# Patient Record
Sex: Female | Born: 2000 | Hispanic: No | Marital: Single | Smoking: Never smoker
Health system: Southern US, Community
[De-identification: ages and names within clinical notes are randomized; demographics above are authoritative.]

## PROBLEM LIST (undated history)

## (undated) DIAGNOSIS — F509 Eating disorder, unspecified: Secondary | ICD-10-CM

---
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
## 2021-03-09 ENCOUNTER — Emergency Department (HOSPITAL_BASED_OUTPATIENT_CLINIC_OR_DEPARTMENT_OTHER)
Admission: EM | Admit: 2021-03-09 | Discharge: 2021-03-10 | Disposition: A | Discharge status: Other Acute Inpatient Hospital | Payer: 59 | Source: Home / Self Care | Attending: Emergency Medicine | Admitting: Emergency Medicine

## 2021-03-09 ENCOUNTER — Other Ambulatory Visit: Payer: Self-pay

## 2021-03-09 DIAGNOSIS — T1491XA Suicide attempt, initial encounter: Secondary | ICD-10-CM

## 2021-03-09 DIAGNOSIS — T43222A Poisoning by selective serotonin reuptake inhibitors, intentional self-harm, initial encounter: Secondary | ICD-10-CM | POA: Insufficient documentation

## 2021-03-09 DIAGNOSIS — F332 Major depressive disorder, recurrent severe without psychotic features: Secondary | ICD-10-CM | POA: Diagnosis not present

## 2021-03-09 DIAGNOSIS — T50902A Poisoning by unspecified drugs, medicaments and biological substances, intentional self-harm, initial encounter: Secondary | ICD-10-CM

## 2021-03-09 DIAGNOSIS — Z20822 Contact with and (suspected) exposure to covid-19: Secondary | ICD-10-CM | POA: Insufficient documentation

## 2021-03-09 DIAGNOSIS — Y900 Blood alcohol level of less than 20 mg/100 ml: Secondary | ICD-10-CM | POA: Insufficient documentation

## 2021-03-09 LAB — RESP PANEL BY RT-PCR (RSV, FLU A&B, COVID)  RVPGX2
Influenza A by PCR: NEGATIVE
Influenza B by PCR: NEGATIVE
Resp Syncytial Virus by PCR: NEGATIVE
SARS Coronavirus 2 by RT PCR: NEGATIVE

## 2021-03-09 LAB — CBC WITH DIFFERENTIAL/PLATELET
Abs Immature Granulocytes: 0.03 10*3/uL (ref 0.00–0.07)
Basophils Absolute: 0.1 10*3/uL (ref 0.0–0.1)
Basophils Relative: 1 %
Eosinophils Absolute: 0.2 10*3/uL (ref 0.0–0.5)
Eosinophils Relative: 1 %
HCT: 40.6 % (ref 36.0–46.0)
Hemoglobin: 13.3 g/dL (ref 12.0–15.0)
Immature Granulocytes: 0 %
Lymphocytes Relative: 14 %
Lymphs Abs: 1.4 10*3/uL (ref 0.7–4.0)
MCH: 28 pg (ref 26.0–34.0)
MCHC: 32.8 g/dL (ref 30.0–36.0)
MCV: 85.5 fL (ref 80.0–100.0)
Monocytes Absolute: 0.5 10*3/uL (ref 0.1–1.0)
Monocytes Relative: 4 %
Neutro Abs: 8.3 10*3/uL — ABNORMAL HIGH (ref 1.7–7.7)
Neutrophils Relative %: 80 %
Platelets: 248 10*3/uL (ref 150–400)
RBC: 4.75 MIL/uL (ref 3.87–5.11)
RDW: 12.5 % (ref 11.5–15.5)
WBC: 10.4 10*3/uL (ref 4.0–10.5)
nRBC: 0 % (ref 0.0–0.2)

## 2021-03-09 LAB — COMPREHENSIVE METABOLIC PANEL
ALT: 8 U/L (ref 0–44)
AST: 19 U/L (ref 15–41)
Albumin: 4.5 g/dL (ref 3.5–5.0)
Alkaline Phosphatase: 31 U/L — ABNORMAL LOW (ref 38–126)
Anion gap: 10 (ref 5–15)
BUN: 10 mg/dL (ref 6–20)
CO2: 21 mmol/L — ABNORMAL LOW (ref 22–32)
Calcium: 9.3 mg/dL (ref 8.9–10.3)
Chloride: 101 mmol/L (ref 98–111)
Creatinine, Ser: 0.73 mg/dL (ref 0.44–1.00)
GFR, Estimated: >60 mL/min (ref >60)
Glucose, Bld: 88 mg/dL (ref 70–99)
Potassium: 4.1 mmol/L (ref 3.5–5.1)
Sodium: 132 mmol/L — ABNORMAL LOW (ref 135–145)
Total Bilirubin: 0.5 mg/dL (ref 0.3–1.2)
Total Protein: 8 g/dL (ref 6.5–8.1)

## 2021-03-09 LAB — RAPID URINE DRUG SCREEN, HOSP PERFORMED
Amphetamines: NOT DETECTED
Barbiturates: NOT DETECTED
Benzodiazepines: NOT DETECTED
Cocaine: NOT DETECTED
Opiates: NOT DETECTED
Tetrahydrocannabinol: NOT DETECTED

## 2021-03-09 LAB — MAGNESIUM: Magnesium: 1.8 mg/dL (ref 1.7–2.4)

## 2021-03-09 LAB — PREGNANCY, URINE: Preg Test, Ur: NEGATIVE

## 2021-03-09 LAB — ETHANOL: Alcohol, Ethyl (B): 10 mg/dL — ABNORMAL HIGH (ref <10)

## 2021-03-09 LAB — SALICYLATE LEVEL: Salicylate Lvl: <7 mg/dL — ABNORMAL LOW (ref 7.0–30.0)

## 2021-03-09 LAB — ACETAMINOPHEN LEVEL: Acetaminophen (Tylenol), Serum: <10 ug/mL — ABNORMAL LOW (ref 10–30)

## 2021-03-09 NOTE — ED Notes (Signed)
ekg repeated: QRS 65; QTC 449

## 2021-03-09 NOTE — ED Notes (Signed)
Pt states that her nausea and vomiting has now p[assed and she is feeling a little bit better but states that she has chills

## 2021-03-09 NOTE — ED Notes (Signed)
Pt. Threw up in trash can after completing triage

## 2021-03-09 NOTE — ED Notes (Signed)
Spoke to [employer] [phone].  Poison control advised to watch for CNS depression, Tachycardia, HTN and QTC prolongation. Poison control advised to watch for K and Mag and to make adjustments related to QTC (>480). Poison Control stated that they would call back and check in.

## 2021-03-09 NOTE — ED Notes (Addendum)
Poison control called for update, said to repeat ekg and she can move to different level of care if QRS < 120 and QTc <500

## 2021-03-09 NOTE — ED Notes (Signed)
Room secured for patient (patient is on suicide precautions).

## 2021-03-09 NOTE — ED Notes (Signed)
pts father's phone number is [phone].pts father updated at bedside, pt gives permission to give information to her father. Please call father if patient changes location.

## 2021-03-09 NOTE — ED Provider Notes (Signed)
[facility] EMERGENCY DEPT Provider Note   CSN: [id number] Arrival date & time: 03/09/21  1318     History Chief Complaint  Patient presents with   Suicide Attempt    Kiara Moore is a 20 y.o. female.  The history is provided by the patient and medical records. No language interpreter was used.  Mental Health Problem Presenting symptoms: depression, suicidal thoughts, suicidal threats and suicide attempt   Presenting symptoms: no agitation, no delusions, no hallucinations, no homicidal ideas and no paranoid behavior   Onset quality:  Gradual Timing:  Constant Progression:  Unchanged Chronicity:  Chronic Treatment compliance:  All of the time Relieved by:  Nothing Worsened by:  Nothing Ineffective treatments:  None tried Associated symptoms: no abdominal pain, no chest pain, no fatigue, no headaches and no insomnia   Risk factors: hx of mental illness   Risk factors: no hx of suicide attempts       Past Medical History:  Diagnosis Date   Eating disorder     Patient Active Problem List   Diagnosis Date Noted   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     Family History  Problem Relation Age of Onset   Diabetes Maternal Grandmother    Diabetes Maternal Grandfather    Hypertension Maternal Grandfather    Diabetes Paternal Grandmother    Hypertension Paternal Grandmother    Diabetes Paternal Grandfather     Social History   Tobacco Use   Smoking status: Never   Smokeless tobacco: Never  Substance Use Topics   Alcohol use: No   Drug use: No    Home Medications Prior to  Admission medications   Medication Sig Start Date End Date Taking? Authorizing Provider  FLUoxetine (PROZAC) 40 MG capsule TAKE 1 CAPSULE BY MOUTH EVERY DAY 12/31/17  Yes Alfonso Ramus T, FNP  cholecalciferol (VITAMIN D) 1000 units tablet Take 3,000 Units by mouth daily.    [provider]    Allergies    Patient has no known allergies.  Review of Systems   Review of Systems  Constitutional:  Positive for chills. Negative for fatigue.  HENT:  Negative for congestion and rhinorrhea.   Eyes:  Negative for visual disturbance.  Respiratory:  Negative for cough, chest tightness, shortness of breath and wheezing.   Cardiovascular:  Negative for chest pain, palpitations and leg swelling.  Gastrointestinal:  Negative for abdominal pain, constipation, diarrhea, nausea and vomiting.  Genitourinary:  Negative for dysuria and flank pain.  Musculoskeletal:  Negative for back pain, neck pain and neck stiffness.  Skin:  Negative for wound.  Neurological:  Negative for dizziness, weakness, light-headedness and headaches.  Psychiatric/Behavioral:  Positive for suicidal ideas. Negative for agitation, hallucinations, homicidal ideas and paranoia. The patient does not have insomnia.   All other systems reviewed and are negative.  Physical Exam Updated Vital Signs BP 107/79 (BP Location: Left Arm)   Pulse 87   Temp (!) 97.5 F (36.4 C) (Oral)   Resp 18   Ht 5\' 3"  (1.6 m)   Wt 53.2 kg   LMP 02/10/2021 (Approximate)   SpO2 100%  BMI 20.78 kg/m   Physical Exam Vitals and nursing note reviewed.  Constitutional:      General: She is not in acute distress.    Appearance: Normal appearance. She is well-developed. She is not ill-appearing, toxic-appearing or diaphoretic.  HENT:     Head: Normocephalic and atraumatic.     Nose: Nose normal. No congestion or rhinorrhea.     Mouth/Throat:     Mouth: Mucous membranes are moist.     Pharynx: No oropharyngeal exudate or posterior  oropharyngeal erythema.  Eyes:     Conjunctiva/sclera: Conjunctivae normal.     Pupils: Pupils are equal, round, and reactive to light.  Cardiovascular:     Rate and Rhythm: Normal rate and regular rhythm.     Pulses: Normal pulses.     Heart sounds: No murmur heard. Pulmonary:     Effort: Pulmonary effort is normal. No respiratory distress.     Breath sounds: Normal breath sounds.  Abdominal:     Palpations: Abdomen is soft.     Tenderness: There is no abdominal tenderness.  Musculoskeletal:        General: No tenderness.     Cervical back: Neck supple. No tenderness.     Right lower leg: No edema.     Left lower leg: No edema.  Skin:    General: Skin is warm and dry.     Capillary Refill: Capillary refill takes less than 2 seconds.     Findings: No erythema.  Neurological:     General: No focal deficit present.     Mental Status: She is alert and oriented to person, place, and time. Mental status is at baseline.     Sensory: No sensory deficit.     Motor: No weakness.     Gait: Gait normal.  Psychiatric:        Mood and Affect: Mood normal.    ED Results / Procedures / Treatments   Labs (all labs ordered are listed, but only abnormal results are displayed) Labs Reviewed  COMPREHENSIVE METABOLIC PANEL - Abnormal; Notable for the following components:      Result Value   Sodium 132 (*)    CO2 21 (*)    Alkaline Phosphatase 31 (*)    All other components within normal limits  ETHANOL - Abnormal; Notable for the following components:   Alcohol, Ethyl (B) 10 (*)    All other components within normal limits  CBC WITH DIFFERENTIAL/PLATELET - Abnormal; Notable for the following components:   Neutro Abs 8.3 (*)    All other components within normal limits  SALICYLATE LEVEL - Abnormal; Notable for the following components:   Salicylate Lvl <7.0 (*)    All other components within normal limits  ACETAMINOPHEN LEVEL - Abnormal; Notable for the following components:    Acetaminophen (Tylenol), Serum <10 (*)    All other components within normal limits  RESP PANEL BY RT-PCR (RSV, FLU A&B, COVID)  [id number]  MAGNESIUM  RAPID URINE DRUG SCREEN, HOSP PERFORMED  PREGNANCY, URINE    EKG EKG Interpretation  Date/Time:  Saturday March 09 2021 14:11:08 EDT Ventricular Rate:  87 PR Interval:  132 QRS Duration: 78 QT Interval:  334 QTC Calculation: 401 R Axis:   52 Text Interpretation: Normal sinus rhythm Normal ECG Confirmed by Vanetta Mulders [phone]) on 03/09/2021 3:23:43 PM  Radiology No results found.  Procedures Procedures   Medications Ordered in ED Medications - No data to display  ED Course  I have reviewed  the triage vital signs and the nursing notes.  Pertinent labs & imaging results that were available during my care of the patient were reviewed by me and considered in my medical decision making (see chart for details).    MDM Rules/Calculators/A&P                          Kiara Moore is a 20 y.o. female with a past medical history significant for depression and previous anorexia per the chart who presents with suicide attempt via overdose.  Patient reports that 11:30 AM, she took around 20 pills of fluoxetine.  Patient did have some nausea and vomiting after but otherwise is just having some chills now.  She denies any current nausea, vomiting, chest pain, palpitations, shortness of breath, lightheadedness, fatigue, abdominal pain, back pain, or extremity pains.  Denies any neurologic deficits.  She reports that she has been having suicidal thoughts on and off for some time and finally decided to act on this morning.  This was an attempt to kill herself.  She denies taking any other medications aside from the fluoxetine and denies any coingestants such as Tylenol, salicylates, alcohol, or other drugs.  She denies any other trauma.  Denies any other complaints.  Denies any preceding symptoms physically.  On exam, lungs clear and chest  nontender.  Abdomen is nontender.  Patient is reporting that this was an attempt to hurt her self.  She denies homicidal ideation.  Denies any hallucinations.  Denies other complaints.  We will place her under IVC for suicide attempt today.  The previous team and triage team called poison control who reports that she needs to have her magnesium and potassium checked among other labs and watch her EKG for QT prolongation.  They report that she will be medically clear approximately 6 hours after ingestion which would be around 5:30 PM this evening.  Patient is currently feeling well and is resting.  Anticipate TTS consultation after patient is medically clear and work-up has been completed.  Will fill out IVC paperwork so that it is ensure that she stays for psychiatric evaluation given suicide attempt today.  7:50 PM Laboratory testing and work-up overall reassuring.  As it is after 7 PM, patient is felt to now be medically cleared from the overdose.  TTS consult will be placed.  She is in the process of being IVC'd right now.  Patient is now medically cleared.  Behavioral urgent care confirms that she will need inpatient management.  She will be transferred under the care of of Dr. Lucianne Muss accepting to await inpatient psychiatric management.  She is under IVC now.   Final Clinical Impression(s) / ED Diagnoses Final diagnoses:  Suicide attempt [facility])  Intentional drug overdose, initial encounter (HCC)     Clinical Impression: 1. Suicide attempt (HCC)   2. Intentional drug overdose, initial encounter [facility])     Disposition: Transfer to behavioral urgent care for further psychiatric management.  This note was prepared with assistance of Conservation officer, historic buildings. Occasional wrong-word or sound-a-like substitutions may have occurred due to the inherent limitations of voice recognition software.      Kimorah Ridolfi, Canary Brim, MD 03/10/21 0010

## 2021-03-09 NOTE — BH Assessment (Signed)
Comprehensive Clinical Assessment (CCA) Note  03/09/2021 Kiara Moore [id number]  DISPOSITION: Gave clinical report to Roselyn Bering, NP who determined Pt meets criteria for inpatient psychiatric treatment. Binnie Rail, [facility], confirmed adult unit is at capacity. Recommendation is for Pt to be transferred to [facility] for continuous assessment while awaiting placement. Notified [facility] staff of pending transfer. Notified Dr. Cristal Deer Tegeler and Marguerita Merles, RN of recommendation.  The patient demonstrates the following risk factors for suicide: Chronic risk factors for suicide include: psychiatric disorder of major depressive disorder . Acute risk factors for suicide include: family or marital conflict. Protective factors for this patient include: positive social support. Considering these factors, the overall suicide risk at this point appears to be high. Patient is not appropriate for outpatient follow up.  Flowsheet Row ED from 03/09/2021 in [facility] Emergency Dept  C-SSRS RISK CATEGORY High Risk       Pt is a 20 year old single female who presents unaccompanied to [facility] after ingesting 20 tabs of fluoxetine in a suicide attempt. Pt has a diagnosis of major depressive disorder. She says she is staying with her parents over the summer and she and her mother had a conflict today. Pt's father told Pt she should move out. Pt states that she has recurring suicidal ideation and decided today to act on those thoughts. She acknowledges this ingestion was a suicide attempt. She denies any history of previous suicide attempts. Pt reports she has a history of NSSIB by scratching her arms with her fingernails. Pt says her mood has vacillated between sad and happy. Pt acknowledges symptoms including crying spells, social withdrawal, loss of interest in usual pleasures, irritability, decreased sleep, and feelings of worthlessness and hopelessness. Pt denies current  homicidal ideation or history of violence. Pt denies any history of auditory or visual hallucinations. Pt denies history of alcohol or other substance use.  Pt identifies conflicts with her parents and academic performance as primary stressors. She says she completed her freshman year at [employer] studying neuroscience. She says during the school year she lives near campus. Pt is not employed. She identifies two friends as her primary social supports. She denies history of abuse. She denies legal problems. She denies access to firearms.  Pt says she cannot remember the name of the NP who prescribes fluoxetine. She says she has had outpatient therapy in the past but is not currently in therapy. Pt was psychiatrically hospitalized in 2018 at [facility] for major depressive disorder and anorexia. Pt does not identify an eating disorder as a current issue.  Pt is dressed in hospital scrubs, alert and oriented x4. Pt speaks in a clear tone, at moderate volume and normal pace. Motor behavior appears normal. Eye contact is good. Pt's mood is anxious and depressed, affect is congruent with mood. Thought process is coherent and relevant. There is no indication Pt is currently responding to internal stimuli or experiencing delusional thought content. Pt was pleasant and cooperative throughout assessment. She says she does not want to be psychiatrically hospitalized.   Chief Complaint:  Chief Complaint  Patient presents with   Suicide Attempt   Visit Diagnosis: F33.2 Major depressive disorder, Recurrent episode, Severe   CCA Screening, Triage and Referral (STR)  Patient Reported Information How did you hear about [place]? No data recorded Referral name: No data recorded Referral phone number: No data recorded  Whom do you see for routine medical problems? No data recorded Practice/Facility Name: No  data recorded Practice/Facility Phone Number: No data recorded Name of Contact: No data  recorded Contact Number: No data recorded Contact Fax Number: No data recorded Prescriber Name: No data recorded Prescriber Address (if known): No data recorded  What Is the Reason for Your Visit/Call Today? No data recorded How Long Has This Been Causing You Problems? No data recorded What Do You Feel Would Help You the Most Today? No data recorded  Have You Recently Been in Any Inpatient Treatment (Hospital/Detox/Crisis Center/28-Day Program)? No data recorded Name/Location of Program/Hospital:No data recorded How Long Were You There? No data recorded When Were You Discharged? No data recorded  Have You Ever Received Services From [facility] Before? No data recorded Who Do You See at [facility]? No data recorded  Have You Recently Had Any Thoughts About Hurting Yourself? No data recorded Are You Planning to Commit Suicide/Harm Yourself At This time? No data recorded  Have you Recently Had Thoughts About Hurting Someone Karolee Ohs? No data recorded Explanation: No data recorded  Have You Used Any Alcohol or Drugs in the Past 24 Hours? No data recorded How Long Ago Did You Use Drugs or Alcohol? No data recorded What Did You Use and How Much? No data recorded  Do You Currently Have a Therapist/Psychiatrist? No data recorded Name of Therapist/Psychiatrist: No data recorded  Have You Been Recently Discharged From Any Office Practice or Programs? No data recorded Explanation of Discharge From Practice/Program: No data recorded    CCA Screening Triage Referral Assessment Type of Contact: No data recorded Is this Initial or Reassessment? No data recorded Date Telepsych consult ordered in [facility]:  No data recorded Time Telepsych consult ordered in [facility]:  No data recorded  Patient Reported Information Reviewed? No data recorded Patient Left Without Being Seen? No data recorded Reason for Not Completing Assessment: No data recorded  Collateral Involvement: No data recorded  Does Patient  Have a Court Appointed Legal Guardian? No data recorded Name and Contact of Legal Guardian: No data recorded If Minor and Not Living with Parent(s), Who has Custody? No data recorded Is CPS involved or ever been involved? No data recorded Is APS involved or ever been involved? No data recorded  Patient Determined To Be At Risk for Harm To Self or Others Based on Review of Patient Reported Information or Presenting Complaint? No data recorded Method: No data recorded Availability of Means: No data recorded Intent: No data recorded Notification Required: No data recorded Additional Information for Danger to Others Potential: No data recorded Additional Comments for Danger to Others Potential: No data recorded Are There Guns or Other Weapons in Your Home? No data recorded Types of Guns/Weapons: No data recorded Are These Weapons Safely Secured?                            No data recorded Who Could Verify You Are Able To Have These Secured: No data recorded Do You Have any Outstanding Charges, Pending Court Dates, Parole/Probation? No data recorded Contacted To Inform of Risk of Harm To Self or Others: No data recorded  Location of Assessment: No data recorded  Does Patient Present under Involuntary Commitment? No data recorded IVC Papers Initial File Date: No data recorded  [place] of Residence: No data recorded  Patient Currently Receiving the Following Services: No data recorded  Determination of Need: No data recorded  Options For Referral: No data recorded    CCA Biopsychosocial Intake/Chief Complaint:  No data recorded Current Symptoms/Problems: No data recorded  Patient Reported Schizophrenia/Schizoaffective Diagnosis in Past: No   Strengths: Pt is intelligent and willing to participate in therapy.  Preferences: No data recorded Abilities: No data recorded  Type of Services Patient Feels are Needed: No data recorded  Initial Clinical Notes/Concerns: No data  recorded  Mental Health Symptoms Depression:   Change in energy/activity; Hopelessness; Irritability; Tearfulness; Worthlessness   Duration of Depressive symptoms:  Greater than two weeks   Mania:   Change in energy/activity; Irritability   Anxiety:    Irritability; Tension; Worrying   Psychosis:   None   Duration of Psychotic symptoms: No data recorded  Trauma:   None   Obsessions:   None   Compulsions:   None   Inattention:   N/A   Hyperactivity/Impulsivity:   N/A   Oppositional/Defiant Behaviors:   N/A   Emotional Irregularity:   None   Other Mood/Personality Symptoms:   NA    Mental Status Exam Appearance and self-care  Stature:   Average   Weight:   Average weight   Clothing:   -- (Scrubs)   Grooming:   Well-groomed   Cosmetic use:   Age appropriate   Posture/gait:   Normal   Motor activity:   Not Remarkable   Sensorium  Attention:   Normal   Concentration:   Normal   Orientation:   X5   Recall/memory:   Normal   Affect and Mood  Affect:   Appropriate; Anxious   Mood:   Depressed   Relating  Eye contact:   Normal   Facial expression:   Responsive   Attitude toward examiner:   Cooperative   Thought and Language  Speech flow:  Clear and Coherent   Thought content:   Appropriate to Mood and Circumstances   Preoccupation:   None   Hallucinations:   None   Organization:  No data recorded  [employer] ServicesExecutive Functions  Fund of Knowledge:   Average   Intelligence:   Average   Abstraction:   Normal   Judgement:   Fair   Dance movement psychotherapisteality Testing:   Realistic   Insight:   Gaps   Decision Making:   Normal; Impulsive   Social Functioning  Social Maturity:   Isolates   Social Judgement:   Normal   Stress  Stressors:   Family conflict; School   Coping Ability:   Overwhelmed   Skill Deficits:   None   Supports:   Family; Friends/Service system     Religion: Religion/Spirituality Are You A  Religious Person?: No How Might This Affect Treatment?: NA  Leisure/Recreation: Leisure / Recreation Do You Have Hobbies?: Yes Leisure and Hobbies: Reading, talking with friends  Exercise/Diet: Exercise/Diet Do You Exercise?: Yes What Type of Exercise Do You Do?: Run/Walk How Many Times a Week Do You Exercise?: 1-3 times a week Have You Gained or Lost A Significant Amount of Weight in the Past Six Months?: No Do You Follow a Special Diet?: No Do You Have Any Trouble Sleeping?: Yes Explanation of Sleeping Difficulties: Pt reports difficulty falling asleep. Averages 5-6 hours per night.   CCA Employment/Education Employment/Work Situation: Employment / Work Situation Employment Situation: Surveyor, mineralstudent Patient's Job has Been Impacted by Current Illness: No Has Patient ever Been in the [employer]ancorpMilitary?: No  Education: Education Is Patient Currently Attending School?: Yes School Currently Attending: [employer] Last Grade Completed: 13 Did You Attend College?: Yes What Type of College Degree Do you Have?: Completed freshman year.  Studying neuroscience. Did You Have An Individualized Education Program (IIEP): No Did You Have Any Difficulty At School?: No Patient's Education Has Been Impacted by Current Illness: No   CCA Family/Childhood History Family and Relationship History: Family history Marital status: Single Does patient have children?: No  Childhood History:  Childhood History By whom was/is the patient raised?: Both parents Did patient suffer any verbal/emotional/physical/sexual abuse as a child?: No Did patient suffer from severe childhood neglect?: No Has patient ever been sexually abused/assaulted/raped as an adolescent or adult?: No Was the patient ever a victim of a crime or a disaster?: No Witnessed domestic violence?: No Has patient been affected by domestic violence as an adult?: No  Child/Adolescent Assessment:     CCA Substance Use Alcohol/Drug Use:                            ASAM's:  Six Dimensions of Multidimensional Assessment  Dimension 1:  Acute Intoxication and/or Withdrawal Potential:      Dimension 2:  Biomedical Conditions and Complications:      Dimension 3:  Emotional, Behavioral, or Cognitive Conditions and Complications:     Dimension 4:  Readiness to Change:     Dimension 5:  Relapse, Continued use, or Continued Problem Potential:     Dimension 6:  Recovery/Living Environment:     ASAM Severity Score:    ASAM Recommended Level of Treatment:     Substance use Disorder (SUD)    Recommendations for Services/Supports/Treatments:    DSM5 Diagnoses: Patient Active Problem List   Diagnosis Date Noted   Secondary amenorrhea 01/20/2017   History of appendectomy 01/20/2017   Anorexia nervosa    MDD (major depressive disorder) 12/25/2016    Patient Centered Plan: Patient is on the following Treatment Plan(s):  Depression   Referrals to Alternative Service(s): Referred to Alternative Service(s):   Place:   Date:   Time:    Referred to Alternative Service(s):   Place:   Date:   Time:    Referred to Alternative Service(s):   Place:   Date:   Time:    Referred to Alternative Service(s):   Place:   Date:   Time:     Pamalee Leyden, [facility]

## 2021-03-09 NOTE — ED Triage Notes (Signed)
At approximately 1130 hours Pt Took an overdose of fluoxetine. She admits to taking fifteen 20 mg pills and five  10 mg pills for a total of 20 pills with water. Pt stated that she wanted to end her life.   Pt reports feeling nauseas.

## 2021-03-09 NOTE — ED Notes (Signed)
Pt roomed in room 13, room/environment secured.

## 2021-03-09 NOTE — ED Notes (Signed)
Pt's belongings inventoried. Pt's belongings placed in patient belongings bag. Father Wahneta Derocher) stated that he will take pt's belongings with him. Pt had gray colored t-shirt, black stretch pants, 1 pair of white tennis shoes, a pair of white socks, a black lace bra, 2 brown colored rubber bands, 2 sets of gold colored earrings and 1 pair of diamond looking stud earrings. Pt placed in burnt orange colored scrubs.

## 2021-03-10 ENCOUNTER — Other Ambulatory Visit: Payer: Self-pay

## 2021-03-10 ENCOUNTER — Ambulatory Visit (HOSPITAL_COMMUNITY)
Admission: EM | Admit: 2021-03-10 | Discharge: 2021-03-10 | Disposition: A | Discharge status: Other Acute Inpatient Hospital | Payer: 59 | Source: Intra-hospital | Attending: Behavioral Health | Admitting: Behavioral Health

## 2021-03-10 ENCOUNTER — Inpatient Hospital Stay (HOSPITAL_COMMUNITY)
Admission: AD | Admit: 2021-03-10 | Discharge: 2021-03-13 | DRG: 885 | Disposition: A | Discharge status: Home / Self Care | Payer: 59 | Source: Other Acute Inpatient Hospital | Attending: Psychiatry | Admitting: Psychiatry

## 2021-03-10 ENCOUNTER — Encounter (HOSPITAL_COMMUNITY): Payer: Self-pay | Admitting: Behavioral Health

## 2021-03-10 ENCOUNTER — Ambulatory Visit: Admission: AD | Admit: 2021-03-10 | Payer: Self-pay | Source: Intra-hospital

## 2021-03-10 DIAGNOSIS — Z20822 Contact with and (suspected) exposure to covid-19: Secondary | ICD-10-CM | POA: Diagnosis present

## 2021-03-10 DIAGNOSIS — Z79899 Other long term (current) drug therapy: Secondary | ICD-10-CM

## 2021-03-10 DIAGNOSIS — L719 Rosacea, unspecified: Secondary | ICD-10-CM | POA: Diagnosis present

## 2021-03-10 DIAGNOSIS — F332 Major depressive disorder, recurrent severe without psychotic features: Secondary | ICD-10-CM | POA: Diagnosis present

## 2021-03-10 DIAGNOSIS — G4700 Insomnia, unspecified: Secondary | ICD-10-CM | POA: Diagnosis present

## 2021-03-10 DIAGNOSIS — T1491XA Suicide attempt, initial encounter: Secondary | ICD-10-CM

## 2021-03-10 DIAGNOSIS — F4010 Social phobia, unspecified: Secondary | ICD-10-CM | POA: Diagnosis present

## 2021-03-10 DIAGNOSIS — T43222A Poisoning by selective serotonin reuptake inhibitors, intentional self-harm, initial encounter: Secondary | ICD-10-CM | POA: Diagnosis present

## 2021-03-10 DIAGNOSIS — R630 Anorexia: Secondary | ICD-10-CM | POA: Diagnosis present

## 2021-03-10 LAB — LIPID PANEL
Cholesterol: 215 mg/dL — ABNORMAL HIGH (ref 0–169)
HDL: 63 mg/dL (ref >40)
LDL Cholesterol: 142 mg/dL — ABNORMAL HIGH (ref 0–99)
Total CHOL/HDL Ratio: 3.4 RATIO
Triglycerides: 51 mg/dL (ref <150)
VLDL: 10 mg/dL (ref 0–40)

## 2021-03-10 LAB — TSH: TSH: 1.881 u[IU]/mL (ref 0.350–4.500)

## 2021-03-10 MED ORDER — MAGNESIUM HYDROXIDE 400 MG/5ML PO SUSP: 30.0000 mL | Freq: Every day | ORAL | Status: DC | PRN

## 2021-03-10 MED ORDER — HYDROXYZINE HCL 25 MG PO TABS: 25.0000 mg | ORAL_TABLET | Freq: Three times a day (TID) | ORAL | Status: DC | PRN

## 2021-03-10 MED ORDER — ACETAMINOPHEN 325 MG PO TABS: 650.0000 mg | ORAL_TABLET | Freq: Four times a day (QID) | ORAL | Status: DC | PRN

## 2021-03-10 MED ORDER — ALUM & MAG HYDROXIDE-SIMETH 200-200-20 MG/5ML PO SUSP: 30.0000 mL | ORAL | Status: DC | PRN

## 2021-03-10 MED ORDER — ENSURE ENLIVE PO LIQD
237.0000 mL | Freq: Two times a day (BID) | ORAL | Status: DC
  Administered 2021-03-10 – 2021-03-13 (×3): 237 mL via ORAL
  Filled 2021-03-10 (×8): qty 237

## 2021-03-10 NOTE — ED Notes (Signed)
Resting with eyes closed. Rise and fall of chest noted. No new issues at this time. Will continue to monitor for safety 

## 2021-03-10 NOTE — Progress Notes (Addendum)
Patient is an 20 year old female who presented to [facility] after taking 20 fluoxetine tablets in a suicide attempt. Pt was initially IVC'd by her father, but has since rescinded and is now voluntary. 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 Pt reported a tense relationship with her mother, and often feels pressured by both of her parents- especially concerning her school. Pt stated that her parents want her to stay pre-med, but she feels that she isn't emotionally up to it. 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 Pt stated that she has been having SI since 2018, but currently is not having self harm thoughts. Pt denied drug/alcohol use. Pt denied A/VH, and does not appear to be responding to internal stimuli. Pt presented as anxious, but was friendly and polite, answered questions logically and coherently throughout admission process. Admission papers completed and signed. Verbal understanding expressed. VS obtained and recorded. Skin assessment revealed no abnormalities. There were no belongings to search. Pt oriented to the unit, and provided with po fluids.

## 2021-03-10 NOTE — ED Notes (Signed)
Safe transport called for transportation to [facility]  

## 2021-03-10 NOTE — Progress Notes (Signed)
Per Celesta Gentile, patient meets criteria for inpatient treatment. There are no available or appropriate beds at [facility] today. CSW faxed referrals to the following facilities for review:  [facility] Or[facility] Pollen  TTS will continue to seek bed placement.  Crissie Reese, MSW, LCSW-A, LCAS-A Phone: [phone] Disposition/TOC

## 2021-03-10 NOTE — ED Provider Notes (Addendum)
Behavioral Health Admission H&P [facility])  Date: 03/10/21 Patient Name: Kiara Moore MRN: [id number] Chief Complaint: No chief complaint on file.     Diagnoses:  Final diagnoses:  Severe recurrent major depression without psychotic features (HCC)  Suicide attempt [facility])    HPI: Kiara Moore is a 20 y.o. who presented to [facility] after overdosing on fluoxetine in a suicide attempt. Patient was recommended transfer to [facility] by Roselyn Bering, NP to await inpatient placement.   On evaluation, patient is alert and oriented x4.  She is pleasant and cooperative.  She states that she overdosed on approximately 25 fluoxetine A mixture of 20 mg and 10 mg.  She states that this was a suicide attempt.  She denies a history of previous suicide attempts.  She reports NSSIB as scratching her arms.  She identifies the dynamics of her relationship with her mother as a trigger.  She states that they had an argument Friday night.  She states the next morning her dad asked her to move out, but she is not sure if he was being serious or not.  She states that this is amplified by summer school.  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  She continues to report some passive suicidal ideations.  Denies intent or plan at this time.  She is able to verbally contract for safety while in the hospital.  She denies homicidal ideations.  She denies auditory and visual hallucinations.  No indication that she is responding to internal stimuli.  Denies paranoia.  No delusions elicited during this assessment.  Denies use of alcohol, marijuana, and other substances.    Patient was inpatient at [facility] in 11/2016 for MDD and anorexia nervosa.  Patient states that she restricts her daily caloric intake to 1600 cal, which she feels is sufficient.  She denies excessive exercise.  She denies binging, purging, and other symptoms of eating disorders.  She denies recent weight  loss.  Per Binnie Rail, [facility], the adult unit at [facility] if at capacity.  PHQ 2-9:   Flowsheet Row ED from 03/09/2021 in [facility]  C-SSRS RISK CATEGORY High Risk        Total Time spent with patient: 30 minutes  Musculoskeletal  Strength & Muscle Tone: within normal limits Gait & Station: normal Patient leans: N/A  Psychiatric Specialty Exam  Presentation General Appearance: Appropriate for Environment; Neat  Eye Contact:Fair  Speech:Clear and Coherent; Normal Rate  Speech Volume:Normal  Handedness:No data recorded  Mood and Affect  Mood:Anxious; Depressed; Worthless; Hopeless  Affect:Congruent; Depressed   Thought Process  Thought Processes:Coherent; Linear  Descriptions of Associations:Intact  Orientation:Full (Time, Place and Person)  Thought Content:WDL  Diagnosis of Schizophrenia or Schizoaffective disorder in past: No   Hallucinations:Hallucinations: None  Ideas of Reference:None  Suicidal Thoughts:Suicidal Thoughts: Yes, Passive SI Passive Intent and/or Plan: Without Intent; Without Plan  Homicidal Thoughts:Homicidal Thoughts: No   Sensorium  Memory:Immediate Good; Recent Good; Remote Good  Judgment:Impaired  Insight:Lacking   Executive Functions  Concentration:Good  Attention Span:Good  Recall:Good  Fund of Knowledge:Good  Language:Good   Psychomotor Activity  Psychomotor Activity:Psychomotor Activity: Normal   Assets  Assets:Communication Skills; Desire for Improvement; Financial Resources/Insurance; Housing; Physical Health; Social Support; Talents/Skills   Sleep  Sleep:Sleep: Fair   Nutritional Assessment (For OBS and [facility] admissions only) Has the patient had a weight loss or gain of 10 pounds or more in the last 3  months?: No Has the patient had a decrease in food intake/or appetite?: No Does the patient have dental problems?: No Does the patient have eating habits or behaviors that may be  indicators of an eating disorder including binging or inducing vomiting?: No Has the patient recently lost weight without trying?: No Has the patient been eating poorly because of a decreased appetite?: No Malnutrition Screening Tool Score: 0    Physical Exam Constitutional:      General: She is not in acute distress.    Appearance: She is not ill-appearing, toxic-appearing or diaphoretic.  HENT:     Head: Normocephalic.     Right Ear: External ear normal.     Left Ear: External ear normal.  Eyes:     Pupils: Pupils are equal, round, and reactive to light.  Cardiovascular:     Rate and Rhythm: Normal rate.  Pulmonary:     Effort: Pulmonary effort is normal. No respiratory distress.  Musculoskeletal:        General: Normal range of motion.  Neurological:     Mental Status: She is alert and oriented to person, place, and time.  Psychiatric:        Mood and Affect: Mood is anxious and depressed.        Thought Content: Thought content is not paranoid or delusional. Thought content does not include homicidal ideation.   Review of Systems  Constitutional:  Negative for chills, diaphoresis, fever, malaise/fatigue and weight loss.  HENT:  Negative for congestion.   Respiratory:  Negative for cough and shortness of breath.   Cardiovascular:  Negative for chest pain and palpitations.  Gastrointestinal:  Negative for diarrhea, nausea and vomiting.  Neurological:  Negative for dizziness and seizures.  Psychiatric/Behavioral:  Positive for depression and suicidal ideas. Negative for hallucinations, memory loss and substance abuse. The patient is nervous/anxious and has insomnia.   All other systems reviewed and are negative.  Last menstrual period 02/10/2021. There is no height or weight on file to calculate BMI.  Past Psychiatric History: MDD, Anorexia Nervosa  Is the patient at risk to self? Yes  Has the patient been a risk to self in the past 6 months? No .    Has the patient been  a risk to self within the distant past? Yes   Is the patient a risk to others? No   Has the patient been a risk to others in the past 6 months? No   Has the patient been a risk to others within the distant past? No   Past Medical History:  Past Medical History:  Diagnosis Date  . Eating disorder     Past Surgical History:  Procedure Laterality Date  . LAPAROSCOPIC APPENDECTOMY N/A 12/03/2016   Procedure: APPENDECTOMY LAPAROSCOPIC;  Surgeon: Leonia Corona, MD;  Location: [facility] OR;  Service: General;  Laterality: N/A;    Family History:  Family History  Problem Relation Age of Onset  . Diabetes Maternal Grandmother   . Diabetes Maternal Grandfather   . Hypertension Maternal Grandfather   . Diabetes Paternal Grandmother   . Hypertension Paternal Grandmother   . Diabetes Paternal Grandfather     Social History:  Social History   Socioeconomic History  . Marital status: Single    Spouse name: Not on file  . Number of children: Not on file  . Years of education: Not on file  . Highest education level: Not on file  Occupational History  . Not on file  Tobacco Use  .  Smoking status: Never  . Smokeless tobacco: Never  Substance and Sexual Activity  . Alcohol use: No  . Drug use: No  . Sexual activity: Never  Other Topics Concern  . Not on file  Social History Narrative   ** Merged History Encounter **       Lives with Mother, Father, Sister (723 years old).  No pets in the house.  No smokers in the house.   Social Determinants of Health   Financial Resource Strain: Not on file  Food Insecurity: Not on file  Transportation Needs: Not on file  Physical Activity: Not on file  Stress: Not on file  Social Connections: Not on file  Intimate Partner Violence: Not on file    SDOH:  SDOH Screenings   Alcohol Screen: Not on file  Depression (ZOX0-9(PHQ2-9): Not on file  Financial Resource Strain: Not on file  Food Insecurity: Not on file  Housing: Not on file  Physical  Activity: Not on file  Social Connections: Not on file  Stress: Not on file  Tobacco Use: Low Risk   . Smoking Tobacco Use: Never  . Smokeless Tobacco Use: Never  Transportation Needs: Not on file    Last Labs:  Admission on 03/09/2021  Component Date Value Ref Range Status  . Magnesium 03/09/2021 1.8  1.7 - 2.4 mg/dL Final   Performed at [facility], [address], [place], [place] [id number]  . SARS Coronavirus 2 by RT PCR 03/09/2021 NEGATIVE  NEGATIVE Final   Comment: (NOTE) SARS-CoV-2 target nucleic acids are NOT DETECTED.  The SARS-CoV-2 RNA is generally detectable in upper respiratory specimens during the acute phase of infection. The lowest concentration of SARS-CoV-2 viral copies this assay can detect is 138 copies/mL. A negative result does not preclude SARS-Cov-2 infection and should not be used as the sole basis for treatment or other patient management decisions. A negative result may occur with  improper specimen collection/handling, submission of specimen other than nasopharyngeal swab, presence of viral mutation(s) within the areas targeted by this assay, and inadequate number of viral copies(<138 copies/mL). A negative result must be combined with clinical observations, patient history, and epidemiological information. The expected result is Negative.  Fact Sheet for Patients:  BloggerCourse.comhttps://www.fda.gov/media/152166/download  Fact Sheet for Healthcare Providers:  SeriousBroker.ithttps://www.fda.gov/media/152162/download  This test is no                          t yet approved or cleared by the [place] FDA and  has been authorized for detection and/or diagnosis of SARS-CoV-2 by FDA under an Emergency Use Authorization (EUA). This EUA will remain  in effect (meaning this test can be used) for the duration of the COVID-19 declaration under Section 564(b)(1) of the Act, 21 U.S.C.section 360bbb-3(b)(1), unless the authorization is terminated  or revoked  sooner.      . Influenza A by PCR 03/09/2021 NEGATIVE  NEGATIVE Final  . Influenza B by PCR 03/09/2021 NEGATIVE  NEGATIVE Final   Comment: (NOTE) The Xpert Xpress SARS-CoV-2/FLU/RSV plus assay is intended as an aid in the diagnosis of influenza from Nasopharyngeal swab specimens and should not be used as a sole basis for treatment. Nasal washings and aspirates are unacceptable for Xpert Xpress SARS-CoV-2/FLU/RSV testing.  Fact Sheet for Patients: BloggerCourse.comhttps://www.fda.gov/media/152166/download  Fact Sheet for Healthcare Providers: SeriousBroker.ithttps://www.fda.gov/media/152162/download  This test is not yet approved or cleared by the [place] FDA and has been authorized for detection and/or diagnosis of SARS-CoV-2  by FDA under an Emergency Use Authorization (EUA). This EUA will remain in effect (meaning this test can be used) for the duration of the COVID-19 declaration under Section 564(b)(1) of the Act, 21 U.S.C. section 360bbb-3(b)(1), unless the authorization is terminated or revoked.    Marland Kitchen Resp Syncytial Virus by PCR 03/09/2021 NEGATIVE  NEGATIVE Final   Comment: (NOTE) Fact Sheet for Patients: BloggerCourse.com  Fact Sheet for Healthcare Providers: SeriousBroker.it  This test is not yet approved or cleared by the [place] FDA and has been authorized for detection and/or diagnosis of SARS-CoV-2 by FDA under an Emergency Use Authorization (EUA). This EUA will remain in effect (meaning this test can be used) for the duration of the COVID-19 declaration under Section 564(b)(1) of the Act, 21 U.S.C. section 360bbb-3(b)(1), unless the authorization is terminated or revoked.  Performed at [facility], [address] [place], [place] [address]   . Sodium 03/09/2021 132 (A) 135 - 145 mmol/L Final  . Potassium 03/09/2021 4.1  3.5 - 5.1 mmol/L Final  . Chloride 03/09/2021 101  98 - 111 mmol/L Final  . CO2  03/09/2021 21 (A) 22 - 32 mmol/L Final  . Glucose, Bld 03/09/2021 88  70 - 99 mg/dL Final   Glucose reference range applies only to samples taken after fasting for at least 8 hours.  . BUN 03/09/2021 10  6 - 20 mg/dL Final  . Creatinine, Ser 03/09/2021 0.73  0.44 - 1.00 mg/dL Final  . Calcium 51/10/5850 9.3  8.9 - 10.3 mg/dL Final  . Total Protein 03/09/2021 8.0  6.5 - 8.1 g/dL Final  . Albumin 77/82/4235 4.5  3.5 - 5.0 g/dL Final  . AST 36/14/4315 19  15 - 41 U/L Final  . ALT 03/09/2021 8  0 - 44 U/L Final  . Alkaline Phosphatase 03/09/2021 31 (A) 38 - 126 U/L Final  . Total Bilirubin 03/09/2021 0.5  0.3 - 1.2 mg/dL Final  . GFR, Estimated 03/09/2021 >60  >60 mL/min Final   Comment: (NOTE) Calculated using the CKD-EPI Creatinine Equation (2021)   . Anion gap 03/09/2021 10  5 - 15 Final   Performed at [facility], [address], [place], [place] [address]  . Alcohol, Ethyl (B) 03/09/2021 10 (A) <10 mg/dL Final   Comment: (NOTE) Lowest detectable limit for serum alcohol is 10 mg/dL.  For medical purposes only. Performed at [facility], [address], [place], [place] [address]   . Opiates 03/09/2021 NONE DETECTED  NONE DETECTED Final  . Cocaine 03/09/2021 NONE DETECTED  NONE DETECTED Final  . Benzodiazepines 03/09/2021 NONE DETECTED  NONE DETECTED Final  . Amphetamines 03/09/2021 NONE DETECTED  NONE DETECTED Final  . Tetrahydrocannabinol 03/09/2021 NONE DETECTED  NONE DETECTED Final  . Barbiturates 03/09/2021 NONE DETECTED  NONE DETECTED Final   Comment: (NOTE) DRUG SCREEN FOR MEDICAL PURPOSES ONLY.  IF CONFIRMATION IS NEEDED FOR ANY PURPOSE, NOTIFY LAB WITHIN 5 DAYS.  LOWEST DETECTABLE LIMITS FOR URINE DRUG SCREEN Drug Class                     Cutoff (ng/mL) Amphetamine and metabolites    1000 Barbiturate and metabolites    200 Benzodiazepine                 200 Tricyclics and metabolites     300 Opiates and metabolites         300 Cocaine and metabolites        300  THC                            50 Performed at [facility], [address] [place], [place] [address]   . WBC 03/09/2021 10.4  4.0 - 10.5 K/uL Final  . RBC 03/09/2021 4.75  3.87 - 5.11 MIL/uL Final  . Hemoglobin 03/09/2021 13.3  12.0 - 15.0 g/dL Final  . HCT 60/45/4098 40.6  36.0 - 46.0 % Final  . MCV 03/09/2021 85.5  80.0 - 100.0 fL Final  . MCH 03/09/2021 28.0  26.0 - 34.0 pg Final  . MCHC 03/09/2021 32.8  30.0 - 36.0 g/dL Final  . RDW 11/91/4782 12.5  11.5 - 15.5 % Final  . Platelets 03/09/2021 248  150 - 400 K/uL Final  . nRBC 03/09/2021 0.0  0.0 - 0.2 % Final  . Neutrophils Relative % 03/09/2021 80  % Final  . Neutro Abs 03/09/2021 8.3 (A) 1.7 - 7.7 K/uL Final  . Lymphocytes Relative 03/09/2021 14  % Final  . Lymphs Abs 03/09/2021 1.4  0.7 - 4.0 K/uL Final  . Monocytes Relative 03/09/2021 4  % Final  . Monocytes Absolute 03/09/2021 0.5  0.1 - 1.0 K/uL Final  . Eosinophils Relative 03/09/2021 1  % Final  . Eosinophils Absolute 03/09/2021 0.2  0.0 - 0.5 K/uL Final  . Basophils Relative 03/09/2021 1  % Final  . Basophils Absolute 03/09/2021 0.1  0.0 - 0.1 K/uL Final  . Immature Granulocytes 03/09/2021 0  % Final  . Abs Immature Granulocytes 03/09/2021 0.03  0.00 - 0.07 K/uL Final   Performed at [facility], [address], [place], [place] [address]  . Preg Test, Ur 03/09/2021 NEGATIVE  NEGATIVE Final   Comment:        THE SENSITIVITY OF THIS METHODOLOGY IS >20 mIU/mL. Performed at [facility], [address], [place], [place] [address]   . Salicylate Lvl 03/09/2021 <7.0 (A) 7.0 - 30.0 mg/dL Final   Performed at [facility], [address] [place], [place] [address]  . Acetaminophen (Tylenol), Serum 03/09/2021 <10 (A) 10 - 30 ug/mL Final   Comment: (NOTE) Therapeutic concentrations vary significantly. A range of 10-30 ug/mL  may be an effective  concentration for many patients. However, some  are best treated at concentrations outside of this range. Acetaminophen concentrations >150 ug/mL at 4 hours after ingestion  and >50 ug/mL at 12 hours after ingestion are often associated with  toxic reactions.  Performed at [facility], [address] [place], [place] [address]   Lab on 10/04/2020  Component Date Value Ref Range Status  . SARS-CoV-2, NAA 2 DAY TAT 10/04/2020 Performed   Final  . SARS-CoV-2, NAA 10/04/2020 Not Detected  Not Detected Final   Comment: This nucleic acid amplification test was developed and its performance characteristics determined by [facility]. Nucleic acid amplification tests include RT-PCR and TMA. This test has not been FDA cleared or approved. This test has been authorized by FDA under an Emergency Use Authorization (EUA). This test is only authorized for the duration of time the declaration that circumstances exist justifying the authorization of the emergency use of in vitro diagnostic tests for detection of SARS-CoV-2 virus and/or diagnosis of COVID-19 infection under section 564(b)(1) of the Act, 21 U.S.C. 841LKG-4(W) (1), unless the authorization is terminated or revoked sooner. When diagnostic testing is negative, the possibility of a false negative result should be considered  in the context of a patient's recent exposures and the presence of clinical signs and symptoms consistent with COVID-19. An individual without symptoms of COVID-19 and who is not shedding SARS-CoV-2 virus wo                          uld expect to have a negative (not detected) result in this assay.     Allergies: Patient has no known allergies.  PTA Medications: (Not in a hospital admission)   Medical Decision Making  Patient was medically cleared in the ED Labs reviewed Add on TSH, Lipid Panel, and Hgb A1C     Recommendations  Based on my evaluation the patient does not  appear to have an emergency medical condition.  Jackelyn Poling, NP 03/10/21  3:26 AM

## 2021-03-10 NOTE — ED Provider Notes (Signed)
FBC/OBS ASAP Discharge Summary  Date and Time: 03/10/2021 2:49 PM  Name: Kiara Moore  MRN:  [id number]   Discharge Diagnoses:  Final diagnoses:  Severe recurrent major depression without psychotic features (HCC)  Suicide attempt [facility])    Subjective: "A lot better than yesterday."   Kiara Moore is a 20 y.o. who presented to [facility] after overdosing on fluoxetine in a suicide attempt. Patient was recommended transfer to [facility] by Roselyn Bering, NP to await inpatient placement.   Stay Summary: Patient seen and examined face to face. She is alert, oriented x 4 and answers questions appropriately. Her mood is depressed, and affect is congruent. Her thought process is logical and speech coherent. Eye contact is fair. She denies SI/HI/AVH. She does not appear to be responding to internal stimuli. She states that she "feels a lot better than yesterday." She states that yesterday she was feeling "really done with a lot of things. I had an argument with my mom, and I did it out of spite."  She states that today she is not suicidal. She was informed that she is recommended for inpatient treatment due to the suicide attempt and will be going to [facility] for treatment. She verbalizes understanding of the treatment plan and denies questions, or concerns. She has been calm and cooperative on the unit. No behavioral issues noted.   Total Time spent with patient: 30 minutes  Past Psychiatric History: MDD, Anxiety and Eating dx.  Past Medical History:  Past Medical History:  Diagnosis Date   Eating disorder     Past Surgical History:  Procedure Laterality Date   LAPAROSCOPIC APPENDECTOMY N/A 12/03/2016   Procedure: APPENDECTOMY LAPAROSCOPIC;  Surgeon: Leonia Corona, MD;  Location: [facility] OR;  Service: General;  Laterality: N/A;   Family History:  Family History  Problem Relation Age of Onset   Diabetes Maternal Grandmother    Diabetes Maternal Grandfather     Hypertension Maternal Grandfather    Diabetes Paternal Grandmother    Hypertension Paternal Grandmother    Diabetes Paternal Grandfather    Family Psychiatric History: Unknown Social History:  Social History   Substance and Sexual Activity  Alcohol Use No     Social History   Substance and Sexual Activity  Drug Use No    Social History   Socioeconomic History   Marital status: Single    Spouse name: Not on file   Number of children: Not on file   Years of education: Not on file   Highest education level: Not on file  Occupational History   Not on file  Tobacco Use   Smoking status: Never   Smokeless tobacco: Never  Substance and Sexual Activity   Alcohol use: No   Drug use: No   Sexual activity: Never  Other Topics Concern   Not on file  Social History Narrative   ** Merged History Encounter **       Lives with Mother, Father, Sister (67 years old).  No pets in the house.  No smokers in the house.   Social Determinants of Health   Financial Resource Strain: Not on file  Food Insecurity: Not on file  Transportation Needs: Not on file  Physical Activity: Not on file  Stress: Not on file  Social Connections: Not on file   SDOH:  SDOH Screenings   Alcohol Screen: Not on file  Depression (OZD6-6): Not on file  Financial Resource Strain: Not on file  Food Insecurity: Not  on file  Housing: Not on file  Physical Activity: Not on file  Social Connections: Not on file  Stress: Not on file  Tobacco Use: Low Risk    Smoking Tobacco Use: Never   Smokeless Tobacco Use: Never  Transportation Needs: Not on file    Tobacco Cessation:  N/A, patient does not currently use tobacco products  Current Medications:  Current Facility-Administered Medications  Medication Dose Route Frequency Provider Last Rate Last Admin   acetaminophen (TYLENOL) tablet 650 mg  650 mg Oral Q6H PRN Jackelyn Poling, NP       alum & mag hydroxide-simeth (MAALOX/MYLANTA) 200-200-20 MG/5ML  suspension 30 mL  30 mL Oral Q4H PRN Nira Conn A, NP       hydrOXYzine (ATARAX/VISTARIL) tablet 25 mg  25 mg Oral TID PRN Jackelyn Poling, NP       magnesium hydroxide (MILK OF MAGNESIA) suspension 30 mL  30 mL Oral Daily PRN Jackelyn Poling, NP       Current Outpatient Medications  Medication Sig Dispense Refill   ONEXTON 1.2-3.75 % GEL Apply 1 application topically daily.     RHOFADE 1 % CREA Apply 1 application topically every morning.      PTA Medications: (Not in a hospital admission)   Musculoskeletal  Strength & Muscle Tone: within normal limits Gait & Station: normal Patient leans: N/A  Psychiatric Specialty Exam  Presentation  General Appearance: Appropriate for Environment  Eye Contact:Fair  Speech:Clear and Coherent  Speech Volume:Normal  Handedness: No data recorded  Mood and Affect  Mood:Depressed  Affect:Congruent   Thought Process  Thought Processes:Coherent  Descriptions of Associations:Intact  Orientation:Full (Time, Place and Person)  Thought Content:WDL  Diagnosis of Schizophrenia or Schizoaffective disorder in past: No    Hallucinations:Hallucinations: None  Ideas of Reference:None  Suicidal Thoughts:Suicidal Thoughts: No SI Passive Intent and/or Plan: Without Intent; Without Plan  Homicidal Thoughts:Homicidal Thoughts: No   Sensorium  Memory:Immediate Fair; Recent Fair; Remote Fair  Judgment:Poor  Insight:Present   Executive Functions  Concentration:Fair  Attention Span:Fair  Recall:Fair  Fund of Knowledge:Fair  Language:Fair   Psychomotor Activity  Psychomotor Activity:Psychomotor Activity: Normal   Assets  Assets:Communication Skills; Desire for Improvement; Housing; Leisure Time; Physical Health   Sleep  Sleep:Sleep: Fair   Nutritional Assessment (For OBS and FBC admissions only) Has the patient had a weight loss or gain of 10 pounds or more in the last 3 months?: No Has the patient had a decrease in  food intake/or appetite?: No Does the patient have dental problems?: No Does the patient have eating habits or behaviors that may be indicators of an eating disorder including binging or inducing vomiting?: No Has the patient recently lost weight without trying?: No Has the patient been eating poorly because of a decreased appetite?: No Malnutrition Screening Tool Score: 0    Physical Exam  Physical Exam HENT:     Head: Normocephalic.  Eyes:     Conjunctiva/sclera: Conjunctivae normal.  Cardiovascular:     Rate and Rhythm: Normal rate.  Pulmonary:     Effort: Pulmonary effort is normal.  Musculoskeletal:        General: Normal range of motion.     Cervical back: Normal range of motion.  Neurological:     General: No focal deficit present.     Mental Status: She is alert and oriented to person, place, and time.  Psychiatric:        Attention and Perception: Attention normal.  Mood and Affect: Mood is depressed.        Speech: Speech normal.        Behavior: Behavior is cooperative.   Review of Systems  Constitutional: Negative.   HENT: Negative.    Eyes: Negative.   Respiratory: Negative.    Cardiovascular: Negative.   Gastrointestinal: Negative.   Musculoskeletal: Negative.   Skin: Negative.   Neurological: Negative.   Endo/Heme/Allergies: Negative.   Psychiatric/Behavioral:  Positive for depression. The patient is nervous/anxious.   Blood pressure 110/68, pulse 95, temperature 98.4 F (36.9 C), resp. rate 16, last menstrual period 02/10/2021, SpO2 100 %. There is no height or weight on file to calculate BMI.  Disposition: Patient recommended for inpatient treatment. Patient accepted to [facility] per Landry Dyke., [facility].   Layla Barter, NP 03/10/2021, 2:49 PM

## 2021-03-10 NOTE — ED Notes (Signed)
Called ems non emergency for transportation to [facility]

## 2021-03-10 NOTE — ED Notes (Signed)
Pt is transfer from [facility], pt under IVC presents with complaint of suicidal ideations, ingested 20 tabs of Fluoxetine.  Denies HI or AVH.  Denies any history of previous attempts.  After argument with mother today, father told pt she should move out.  Skin search completed, pt A&O x 4, no distress noted, calm & cooperative.  Monitoring for safety.

## 2021-03-10 NOTE — ED Notes (Signed)
Pt given breakfast.

## 2021-03-10 NOTE — Progress Notes (Signed)
Per Lona Kettle, [facility], pt has been accepted to [facility] bed 301-1. Accepting provider is Dr. Jola Babinski. Patient can arrive after 2:00pm. Number for report is [phone].   Crissie Reese, MSW, LCSW-A Phone: [phone] Disposition/TOC

## 2021-03-10 NOTE — ED Notes (Signed)
Ambulated to back s[place]port to meet safe transport. Transporting to [facility]. Medically stable at this time.

## 2021-03-10 NOTE — ED Notes (Signed)
Report called to Valerie, RN.

## 2021-03-10 NOTE — ED Notes (Signed)
When asked, Pt says name is pronounced  "[id number]".

## 2021-03-10 NOTE — Progress Notes (Signed)
Patient signed 72 hour request for discharge at 1558 today

## 2021-03-10 NOTE — Discharge Instructions (Addendum)
Discharge to [facility] 

## 2021-03-11 ENCOUNTER — Encounter (HOSPITAL_COMMUNITY): Payer: Self-pay | Admitting: Behavioral Health

## 2021-03-11 DIAGNOSIS — F4010 Social phobia, unspecified: Secondary | ICD-10-CM

## 2021-03-11 DIAGNOSIS — F332 Major depressive disorder, recurrent severe without psychotic features: Principal | ICD-10-CM

## 2021-03-11 HISTORY — DX: Social phobia, unspecified: F40.10

## 2021-03-11 LAB — HEMOGLOBIN A1C
Hgb A1c MFr Bld: 5.4 % (ref 4.8–5.6)
Mean Plasma Glucose: 108 mg/dL
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
## 2021-03-11 NOTE — BHH Suicide Risk Assessment (Signed)
[facility] Admission Suicide Risk Assessment   Nursing information obtained from:  Patient Demographic factors:  Adolescent or young adult Current Mental Status:  Suicidal ideation indicated by patient Loss Factors:  NA Historical Factors:  Impulsivity Risk Reduction Factors:  Sense of responsibility to family, Living with another person, especially a relative  Total Time spent with patient:  56 Minutes Principal Problem: MDD (major depressive disorder), recurrent severe, without psychosis ([place]) Diagnosis:  Principal Problem:   MDD (major depressive disorder), recurrent severe, without psychosis ([place])  Subjective Data: Medical record reviewed.  Patient's case discussed in detail with members of the treatment team.  I met with and evaluated the patient on the unit today.  Kiara Moore is a 20 year old female with prior psychiatric diagnoses of depression and anorexia who presented to [place] ED on 03/09/2021 after she took approximately 20 tablets of fluoxetine in a suicide attempt at 1130AM that morning.  Following this overdose she experienced some nausea, chills and headache but no other sequelae.  Initial EKG on 03/09/2021 revealed normal sinus rhythm with ventricular rate of 87 and QT/QTc of 334/401.  Subsequent EKG at approximately 6:20 PM that evening revealed sinus rhythm with ventricular rate of 85 and QT/QTc of 377/449.  After being deemed medically stable, she was transferred to [facility] and then to [facility] on 03/10/2021 for further treatment of depression and anxiety symptoms.  On interview with me today, the patient acknowledges that she took the overdose of Prozac in a suicide attempt.  The patient states that she had been started on Prozac in February 2022 for depression and anxiety.  She took Prozac consistently for about a month but since March or April she has only been taking it about 2 times per week.  She reports experiencing  significant stress with her demanding academic load at school and that she had felt "tired of a lot of things" in the months leading up to the attempt and had an argument with her mother just prior to the overdose.  Patient states that she is a perfectionist and feels she needs to consistently live up to high expectations and standards.  She reports symptoms at least for the past 2 weeks including anhedonia, intermittently depressed mood, decreased sleep, decreased energy, decreased interest, decreased appetite, problems concentrating, feelings of guilt and worthlessness, intermittent irritability with reactions to stressors out of proportion to the degree of the stressor, daytime napping.  Patient reports experiencing suicidal ideation on and off since the seventh grade but is never made attempt except for the 1 prior to the current admission.  Patient also reports concerns about her weight and appearance and more frequently has been focused on counting calories, food intake and body image issues.  She has engaged in intermittent binging on food followed by restriction of food intake.  She denies vomiting, laxative use or excessive exercise.  Today she states that her mood is "fine" and denies thoughts of harming herself in the hospital.  She denies AI, HI, PI, AH or VH.  She reports feeling a bit jittery today but denies other physical experiences attributable to her recent Prozac overdose.  Patient states she would like to have a therapist outside the hospital.  She currently has a psychiatric nurse practitioner prescribing medications for her but does not have a therapist.  The patient states she has been diagnosed with depression and social anxiety disorder in the past as well as anorexia.  The patient reports 1 prior inpatient  psychiatric admission to [facility] at age 54 for treatment of anorexia/bulimia and suicidal ideation.  Review of medical records indicate she was admitted from 12/25/2016 until 12/30/2016  and was diagnosed with MDD and anorexia and treated with Prozac 20 mg daily and hydroxyzine 10 mg 3 times daily as needed.  She denies any prior suicide attempts.  Patient denies any symptoms that meet criteria for hypomania or mania.  She reports a history of nonsuicidal self-injurious behavior by scratching herself when she is angry.  Patient reports that she experiences social anxiety that occurs when she is meeting groups of people, introducing herself to others or engages in public speaking.  She worries that others will judge her negatively.  She experiences symptoms of anxiety, worry, increased heart rate, increased respiratory rate and feeling like her throat is closing.  She sees a psychiatric nurse practitioner Larene Beach at [facility].  The patient denies any history of use of alcohol, marijuana, other drugs.  She does not vape or use tobacco products.  The patient reports that she has a sister with Williams syndrome who experiences some anxiety.  She denies any family history of suicide or other mental health issues.  The patient reports denies any history of asthma, seizure, concussion.  She denies any known drug allergies.  Continued Clinical Symptoms:  Alcohol Use Disorder Identification Test Final Score (AUDIT): 0 The "Alcohol Use Disorders Identification Test", Guidelines for Use in Primary Care, Second Edition.  World Pharmacologist [facility]). Score between 0-7:  no or low risk or alcohol related problems. Score between 8-15:  moderate risk of alcohol related problems. Score between 16-19:  high risk of alcohol related problems. Score 20 or above:  warrants further diagnostic evaluation for alcohol dependence and treatment.   CLINICAL FACTORS:   Severe Anxiety and/or Agitation Depression:   Anhedonia Impulsivity Insomnia Eating disorder Previous Psychiatric Diagnoses and Treatments   Musculoskeletal: Strength & Muscle Tone: within normal  limits Gait & Station: normal Patient leans: N/A  Psychiatric Specialty Exam:  Presentation  General Appearance: Appropriate for Environment  Eye Contact:Fair  Speech:Clear and Coherent  Speech Volume:Normal  Handedness: No data recorded  Mood and Affect  Mood:Anxious; Depressed  Affect:Congruent   Thought Process  Thought Processes:Coherent; Goal Directed; Linear  Descriptions of Associations:Intact  Orientation:Full (Time, Place and Person)  Thought Content:Logical  History of Schizophrenia/Schizoaffective disorder:No  Duration of Psychotic Symptoms:No data recorded Hallucinations:Hallucinations: None  Ideas of Reference:None  Suicidal Thoughts:Suicidal Thoughts: No SI Passive Intent and/or Plan: Without Intent; Without Plan  Homicidal Thoughts:Homicidal Thoughts: No   Sensorium  Memory:Immediate Good; Recent Good; Remote Good  Judgment:Fair  Insight:Fair   Executive Functions  Concentration:Good  Attention Span:Good  Recall:Good  Fund of Knowledge:Good  Language:Good   Psychomotor Activity  Psychomotor Activity:Psychomotor Activity: Normal   Assets  Assets:Communication Skills; Desire for Improvement; Housing; Leisure Time; Talents/Skills; Vocational/Educational; Social Support; Physical Health   Sleep  Sleep:Sleep: Fair    Physical Exam: Physical Exam Vitals and nursing note reviewed.  Constitutional:      General: She is not in acute distress.    Appearance: Normal appearance. She is not diaphoretic.  HENT:     Head: Normocephalic and atraumatic.  Pulmonary:     Effort: Pulmonary effort is normal.  Neurological:     General: No focal deficit present.     Mental Status: She is alert and oriented to person, place, and time.   Review of Systems  Psychiatric/Behavioral:  Positive for  depression. The patient is nervous/anxious.   All other systems reviewed and are negative.  Blood pressure 104/74, pulse (!) 107,  temperature 98.2 F (36.8 C), temperature source Oral, resp. rate 16, height 5' 3"  (1.6 m), weight 50.8 kg, last menstrual period 02/10/2021, SpO2 98 %. Body mass index is 19.84 kg/m.   COGNITIVE FEATURES THAT CONTRIBUTE TO RISK:  None    SUICIDE RISK:   Mild:  Suicidal ideation of limited frequency, intensity, duration, and specificity.  There are no identifiable plans, no associated intent, mild dysphoria and related symptoms, good self-control (both objective and subjective assessment), few other risk factors, and identifiable protective factors, including available and accessible social support.  PLAN OF CARE: The patient has been admitted to the 300 inpatient unit.  Continue every 15-minute observation level.  Encouraged participation in group therapy and therapeutic milieu.  Lab results reviewed.  CMP with sodium of 132, CO2 of 21 and alkaline phosphatase of 31 otherwise WNL.  Lipid panel with total cholesterol of 215, LDL of 142 and otherwise WNL.  CBC was WNL.  Neutrophil count was 8300 and differential was otherwise WNL.  Hemoglobin A1c was 5.4.  Urine pregnancy test was negative.  TSH was 1.881.  Urine drug screen was negative.  Will defer resuming treatment with Prozac for now since patient had recent ingestion of at least #20 20 mg tablets on 03/09/2021.  Plan will be eventually to resume treatment with Prozac and encourage daily adherence to medication.  Prozac will be prescribed for treatment of depression, anxiety and to reduce binge eating.  We will use as needed hydroxyzine for anxiety.  Patient has been written for Ensure supplements to improve caloric intake.  See MAR.  She will need referral to therapist after discharge.  Estimated length of stay 3 to 5 days.  I certify that inpatient services furnished can reasonably be expected to improve the patient's condition.   Arthor Captain, MD 03/11/2021, 1:03 PM

## 2021-03-11 NOTE — BHH Group Notes (Signed)
Occupational Therapy Group Note Date: 03/11/2021 Group Topic/Focus: Strengths Exploration  Group Description: Group encouraged increased participation and engagement through discussion focused on STRENGTHS. Patients were encouraged to fill out a worksheet to structure discussion, that included identifying strengths as it relates to one's relationships, profession, and personal fulfillment. Discussion followed with patients sharing their responses and highlighting their own personal strengths.  Therapeutic Goals: Identify strengths vs weaknesses Discuss and identify ways we can highlight our strengths  Participation Level: Patient did not attend OT group session despite personal invitation.    Plan: Continue to engage patient in OT groups 2 - 3x/week.  03/11/2021  Makinsley Schiavi, MOT, OTR/L 

## 2021-03-11 NOTE — Progress Notes (Signed)
D:  Patient denied SI and HI, contracts for safety.  Denied A/V hallucinations.   A:  Medications administered per MD orders.  Emotional support and encouragement given patient. R:  Safety maintained with 15 minute checks.  

## 2021-03-11 NOTE — BHH Group Notes (Signed)
Patient did not attend morning goals and activity group. 

## 2021-03-11 NOTE — Plan of Care (Signed)
Nurse discussed anxiety, depression and coping skills with patient.  

## 2021-03-11 NOTE — Progress Notes (Signed)
Psychoeducational Group Note  Date:  03/11/2021 Time: 2025  Group Topic/Focus:  Wrap-Up Group:   The focus of this group is to help patients review their daily goal of treatment and discuss progress on daily workbooks.  Participation Level: Did Not Attend  Participation Quality:  Not Applicable  Affect:  Not Applicable  Cognitive:  Not Applicable  Insight:  Not Applicable  Engagement in Group: Not Applicable  Additional Comments: The patient did not attend group this evening.   Hazle Coca S 03/11/2021, 8:25 PM

## 2021-03-11 NOTE — Tx Team (Signed)
Interdisciplinary Treatment and Diagnostic Plan Update  03/11/2021 Time of Session: 1:00pm Kiara Moore MRN: [id number]  Principal Diagnosis: MDD (major depressive disorder), recurrent severe, without psychosis ([place])  Secondary Diagnoses: Principal Problem:   MDD (major depressive disorder), recurrent severe, without psychosis ([place]) Active Problems:   Social anxiety disorder   Current Medications:  Current Facility-Administered Medications  Medication Dose Route Frequency Provider Last Rate Last Admin   acetaminophen (TYLENOL) tablet 650 mg  650 mg Oral Q6H PRN White, Patrice L, NP       alum & mag hydroxide-simeth (MAALOX/MYLANTA) 200-200-20 MG/5ML suspension 30 mL  30 mL Oral Q4H PRN White, Patrice L, NP       feeding supplement (ENSURE ENLIVE / ENSURE PLUS) liquid 237 mL  237 mL Oral BID BM Lavella Hammock, MD   237 mL at 03/10/21 1805   hydrOXYzine (ATARAX/VISTARIL) tablet 25 mg  25 mg Oral TID PRN White, Patrice L, NP       magnesium hydroxide (MILK OF MAGNESIA) suspension 30 mL  30 mL Oral Daily PRN White, Patrice L, NP       PTA Medications: Medications Prior to Admission  Medication Sig Dispense Refill Last Dose   ONEXTON 1.2-3.75 % GEL Apply 1 application topically daily.      RHOFADE 1 % CREA Apply 1 application topically every morning.       Patient Stressors:    Patient Strengths:    Treatment Modalities: Medication Management, Group therapy, Case management,  1 to 1 session with clinician, Psychoeducation, Recreational therapy.   Physician Treatment Plan for Primary Diagnosis: MDD (major depressive disorder), recurrent severe, without psychosis ([place]) Long Term Goal(s): Improvement in symptoms so as ready for discharge   Short Term Goals: Ability to identify changes in lifestyle to reduce recurrence of condition will improve Ability to verbalize feelings will improve Ability to disclose and discuss suicidal ideas Ability to demonstrate self-control will  improve Ability to identify and develop effective coping behaviors will improve Compliance with prescribed medications will improve Ability to identify triggers associated with substance abuse/mental health issues will improve  Medication Management: Evaluate patient's response, side effects, and tolerance of medication regimen.  Therapeutic Interventions: 1 to 1 sessions, Unit Group sessions and Medication administration.  Evaluation of Outcomes: Not Met  Physician Treatment Plan for Secondary Diagnosis: Principal Problem:   MDD (major depressive disorder), recurrent severe, without psychosis ([place]) Active Problems:   Social anxiety disorder  Long Term Goal(s): Improvement in symptoms so as ready for discharge   Short Term Goals: Ability to identify changes in lifestyle to reduce recurrence of condition will improve Ability to verbalize feelings will improve Ability to disclose and discuss suicidal ideas Ability to demonstrate self-control will improve Ability to identify and develop effective coping behaviors will improve Compliance with prescribed medications will improve Ability to identify triggers associated with substance abuse/mental health issues will improve     Medication Management: Evaluate patient's response, side effects, and tolerance of medication regimen.  Therapeutic Interventions: 1 to 1 sessions, Unit Group sessions and Medication administration.  Evaluation of Outcomes: Not Met   RN Treatment Plan for Primary Diagnosis: MDD (major depressive disorder), recurrent severe, without psychosis ([place]) Long Term Goal(s): Knowledge of disease and therapeutic regimen to maintain health will improve  Short Term Goals: Ability to remain free from injury will improve, Ability to verbalize frustration and anger appropriately will improve, Ability to demonstrate self-control, Ability to identify and develop effective coping behaviors will improve, and Compliance with  prescribed medications will improve  Medication Management: RN will administer medications as ordered by provider, will assess and evaluate patient's response and provide education to patient for prescribed medication. RN will report any adverse and/or side effects to prescribing provider.  Therapeutic Interventions: 1 on 1 counseling sessions, Psychoeducation, Medication administration, Evaluate responses to treatment, Monitor vital signs and CBGs as ordered, Perform/monitor CIWA, COWS, AIMS and Fall Risk screenings as ordered, Perform wound care treatments as ordered.  Evaluation of Outcomes: Not Met   LCSW Treatment Plan for Primary Diagnosis: MDD (major depressive disorder), recurrent severe, without psychosis ([place]) Long Term Goal(s): Safe transition to appropriate next level of care at discharge, Engage patient in therapeutic group addressing interpersonal concerns.  Short Term Goals: Engage patient in aftercare planning with referrals and resources, Increase social support, Increase ability to appropriately verbalize feelings, Increase emotional regulation, Identify triggers associated with mental health/substance abuse issues, and Increase skills for wellness and recovery  Therapeutic Interventions: Assess for all discharge needs, 1 to 1 time with Social worker, Explore available resources and support systems, Assess for adequacy in community support network, Educate family and significant other(s) on suicide prevention, Complete Psychosocial Assessment, Interpersonal group therapy.  Evaluation of Outcomes: Not Met   Progress in Treatment: Attending groups: Yes. Participating in groups: Yes. Taking medication as prescribed: Yes. Toleration medication: Yes. Family/Significant other contact made: No, will contact:  father Patient understands diagnosis: Yes. Discussing patient identified problems/goals with staff: Yes. Medical problems stabilized or resolved: Yes. Denies  suicidal/homicidal ideation: Yes. Issues/concerns per patient self-inventory: No.   New problem(s) identified: No, Describe:  none  New Short Term/Long Term Goal(s): Medication stabilization, elimination of SI thoughts, development of comprehensive mental wellness plan.    Patient Goals:  Did not attend  Discharge Plan or Barriers: Patient recently admitted. CSW will continue to follow and assess for appropriate referrals and possible discharge planning.    Reason for Continuation of Hospitalization: Anxiety Depression Medication stabilization Suicidal ideation  Estimated Length of Stay: 3-5 days  Attendees: Patient: Did not attend 03/11/2021   Physician: Ethelene Browns, MD 03/11/2021   Nursing:  03/11/2021   RN Care Manager: 03/11/2021   Social Worker: Darletta Moll 03/11/2021   Recreational Therapist:  03/11/2021   Other:  03/11/2021   Other:  03/11/2021   Other: 03/11/2021     Scribe for Treatment Team: Vassie Moselle, LCSW 03/11/2021 1:48 PM

## 2021-03-11 NOTE — BHH Suicide Risk Assessment (Signed)
BHH INPATIENT:  Family/Significant Other Suicide Prevention Education  Suicide Prevention Education:  Education Completed; Kiara Moore,father [phone]  (name of family member/significant other) has been identified by the patient as the family member/significant other with whom the patient will be residing, and identified as the person(s) who will aid the patient in the event of a mental health crisis (suicidal ideations/suicide attempt).  With written consent from the patient, the family member/significant other has been provided the following suicide prevention education, prior to the and/or following the discharge of the patient.  The suicide prevention education provided includes the following: Suicide risk factors Suicide prevention and interventions National Suicide Hotline telephone number [facility] assessment telephone number [facility] Emergency Assistance 911 [facility] and/or Residential Mobile Crisis Unit telephone number  Request made of family/significant other to: Remove weapons (e.g., guns, rifles, knives), all items previously/currently identified as safety concern.   Remove drugs/medications (over-the-counter, prescriptions, illicit drugs), all items previously/currently identified as a safety concern.  The family member/significant other verbalizes understanding of the suicide prevention education information provided.  The family member/significant other agrees to remove the items of safety concern listed above. CSW spoke with pt who reported no safety concerns in the home. CSW also spoke with pt's father who stated,  " we have no firearms in the home, my daughter is a Consulting civil engineer at [employer], she will be home until July, it will be difficult to monitor medications from [facility], however we will monitor until she leaves. We feel this was a lapse in judgment and hopefully this will not happen again"   Kiara Moore 03/11/2021, 4:04 PM

## 2021-03-11 NOTE — Progress Notes (Signed)
Recreation Therapy Notes  Date:  6.13.22 Time: 0930 Location: 300 Hall Dayroom  Group Topic: Stress Management  Goal Area(s) Addresses:  Patient will identify positive stress management techniques. Patient will identify benefits of using stress management post d/c.  Intervention: Stress Managment  Activity :  Meditation.  LRT played a meditation that focused on setting boundaries for your own self care. Patients were to listen and follow as meditation was read to fully engage in activity.  Education:  Stress Management, Discharge Planning.   Education Outcome: Acknowledges Education  Clinical Observations/Feedback:  Pt did not attend group session.    Caroll Rancher, LRT/CTRS         Caroll Rancher A 03/11/2021 11:33 AM

## 2021-03-11 NOTE — Progress Notes (Signed)
Patient spent most of shift resting in the bed. Cooperative with treatment. She denies SI/HIAVH. She had no concerns on shift.

## 2021-03-11 NOTE — H&P (Signed)
Psychiatric Admission Assessment Adult  Patient Identification: Kiara Moore MRN:  [id number] Date of Evaluation:  03/11/2021 Chief Complaint:  MDD (major depressive disorder), recurrent severe, without psychosis ([place]) [F33.2] Principal Diagnosis: MDD (major depressive disorder), recurrent severe, without psychosis ([place]) Diagnosis:  Principal Problem:   MDD (major depressive disorder), recurrent severe, without psychosis ([place]) Active Problems:   Social anxiety disorder  History of Present Illness: Medical record reviewed.  Patient's case discussed in detail with members of the treatment team.  I met with and evaluated the patient on the unit today.  Kiara Moore is a 20 year old female with prior psychiatric diagnoses of depression and anorexia who presented to [facility] on 03/09/2021 after she took approximately 20 tablets of fluoxetine in a suicide attempt at 1130AM that morning.  Following this overdose she experienced some nausea, chills and headache but no other sequelae.  Initial EKG on 03/09/2021 revealed normal sinus rhythm with ventricular rate of 87 and QT/QTc of 334/401.  Subsequent EKG at approximately 6:20 PM that evening revealed sinus rhythm with ventricular rate of 85 and QT/QTc of 377/449.  After being deemed medically stable, she was transferred to [facility] and then to [facility] on 03/10/2021 for further treatment of depression and anxiety symptoms.  On interview with me today, the patient acknowledges that she took the overdose of Prozac in a suicide attempt.  The patient states that she had been started on Prozac in February 2022 for depression and anxiety.  She took Prozac consistently for about a month but since March or April she has only been taking it about 2 times per week.  She reports experiencing significant stress with her demanding academic load at school and that she had felt "tired of a lot of things" in the  months leading up to the attempt and had an argument with her mother just prior to the overdose.  Patient states that she is a perfectionist and feels she needs to consistently live up to high expectations and standards.  She reports symptoms at least for the past 2 weeks including anhedonia, intermittently depressed mood, decreased sleep, decreased energy, decreased interest, decreased appetite, problems concentrating, feelings of guilt and worthlessness, intermittent irritability with reactions to stressors out of proportion to the degree of the stressor, daytime napping.  Patient reports experiencing suicidal ideation on and off since the seventh grade but is never made attempt except for the 1 prior to the current admission.  Patient also reports concerns about her weight and appearance and more frequently has been focused on counting calories, food intake and body image issues.  She has engaged in intermittent binging on food followed by restriction of food intake.  She denies vomiting, laxative use or excessive exercise.  Today she states that her mood is "fine" and denies thoughts of harming herself in the hospital.  She denies AI, HI, PI, AH or VH.  She reports feeling a bit jittery today but denies other physical experiences attributable to her recent Prozac overdose.  Patient states she would like to have a therapist outside the hospital.  She currently has a psychiatric nurse practitioner prescribing medications for her but does not have a therapist.   The patient states she has been diagnosed with depression and social anxiety disorder in the past as well as anorexia.  The patient reports 1 prior inpatient psychiatric admission to [facility] at age 49 for treatment of anorexia/bulimia and suicidal ideation.  Review of medical records indicate she  was admitted from 12/25/2016 until 12/30/2016 and was diagnosed with MDD and anorexia and treated with Prozac 20 mg daily and hydroxyzine 10 mg 3 times daily as  needed.  She denies any prior suicide attempts.  Patient denies any symptoms that meet criteria for hypomania or mania.  She reports a history of nonsuicidal self-injurious behavior by scratching herself when she is angry.  Patient reports that she experiences social anxiety that occurs when she is meeting groups of people, introducing herself to others or engages in public speaking.  She worries that others will judge her negatively.  She experiences symptoms of anxiety, worry, increased heart rate, increased respiratory rate and feeling like her throat is closing.  She sees a psychiatric nurse practitioner Larene Beach at [facility].   The patient denies any history of use of alcohol, marijuana, other drugs.  She does not vape or use tobacco products.  The patient reports that she has a sister with Williams syndrome who experiences some anxiety.  She denies any family history of suicide or other mental health issues.  The patient reports denies any history of asthma, seizure, concussion.  She denies any known drug allergies.  Associated Signs/Symptoms: Depression Symptoms:  depressed mood, anhedonia, insomnia, fatigue, feelings of worthlessness/guilt, difficulty concentrating, recurrent thoughts of death, suicidal thoughts with specific plan, suicidal attempt, anxiety, decreased appetite, Duration of Depression Symptoms: Greater than two weeks  (Hypo) Manic Symptoms:   Denies Anxiety Symptoms:  Excessive Worry, Social Anxiety, Psychotic Symptoms:   Denies PTSD Symptoms: NA Total Time spent with patient:  50 minutes  Past Psychiatric History: The patient states she has been diagnosed with depression and social anxiety disorder in the past as well as anorexia.  The patient reports 1 prior inpatient psychiatric admission to [facility] at age 39 for treatment of anorexia/bulimia and suicidal ideation.  Review of medical records indicate she was admitted from  12/25/2016 until 12/30/2016 and was diagnosed with MDD and anorexia and treated with Prozac 20 mg daily and hydroxyzine 10 mg 3 times daily as needed.  She denies any prior suicide attempts.  Patient denies any symptoms that meet criteria for hypomania or mania.  She reports a history of nonsuicidal self-injurious behavior by scratching herself when she is angry.  Patient reports that she experiences social anxiety that occurs when she is meeting groups of people, introducing herself to others or engages in public speaking.  She worries that others will judge her negatively.  She experiences symptoms of anxiety, worry, increased heart rate, increased respiratory rate and feeling like her throat is closing.  She sees a psychiatric nurse practitioner Larene Beach at [facility].  Is the patient at risk to self? Yes.    Has the patient been a risk to self in the past 6 months? Yes.    Has the patient been a risk to self within the distant past? No.  Is the patient a risk to others? No.  Has the patient been a risk to others in the past 6 months? No.  Has the patient been a risk to others within the distant past? No.   Prior Inpatient Therapy:   Prior Outpatient Therapy:    Alcohol Screening: 1. How often do you have a drink containing alcohol?: Never 2. How many drinks containing alcohol do you have on a typical day when you are drinking?: 1 or 2 3. How often do you have six or more drinks on one  occasion?: Never AUDIT-C Score: 0 4. How often during the last year have you found that you were not able to stop drinking once you had started?: Never 5. How often during the last year have you failed to do what was normally expected from you because of drinking?: Never 6. How often during the last year have you needed a first drink in the morning to get yourself going after a heavy drinking session?: Never 7. How often during the last year have you had a feeling of guilt of  remorse after drinking?: Never 8. How often during the last year have you been unable to remember what happened the night before because you had been drinking?: Never 9. Have you or someone else been injured as a result of your drinking?: No 10. Has a relative or friend or a doctor or another health worker been concerned about your drinking or suggested you cut down?: No Alcohol Use Disorder Identification Test Final Score (AUDIT): 0 Substance Abuse History in the last 12 months:  No. Consequences of Substance Abuse: NA Previous Psychotropic Medications: Yes  Psychological Evaluations: Yes  Past Medical History:  Past Medical History:  Diagnosis Date   Eating disorder    Social anxiety disorder 03/11/2021    Past Surgical History:  Procedure Laterality Date   LAPAROSCOPIC APPENDECTOMY N/A 12/03/2016   Procedure: APPENDECTOMY LAPAROSCOPIC;  Surgeon: Gerald Stabs, MD;  Location: [facility] OR;  Service: General;  Laterality: N/A;   Family History:  Family History  Problem Relation Age of Onset   Diabetes Maternal Grandmother    Diabetes Maternal Grandfather    Hypertension Maternal Grandfather    Diabetes Paternal Grandmother    Hypertension Paternal Grandmother    Diabetes Paternal Grandfather    Family Psychiatric  History: The patient reports that she has a sister with Williams syndrome who experiences some anxiety.  She denies any family history of suicide or other mental health issues. Tobacco Screening:   Social History:  Social History   Substance and Sexual Activity  Alcohol Use No     Social History   Substance and Sexual Activity  Drug Use No    Additional Social History: Marital status: Single Are you sexually active?: No What is your sexual orientation?: Straight Does patient have children?: No                         Allergies:  No Known Allergies Lab Results:  Results for orders placed or performed during the hospital encounter of 03/10/21 (from the  past 48 hour(s))  Hemoglobin A1c     Status: None   Collection Time: 03/10/21  3:52 AM  Result Value Ref Range   Hgb A1c MFr Bld 5.4 4.8 - 5.6 %    Comment: (NOTE)         Prediabetes: 5.7 - 6.4         Diabetes: >6.4         Glycemic control for adults with diabetes: <7.0    Mean Plasma Glucose 108 mg/dL    Comment: (NOTE) Performed At: [facility] [place], [place] [id number] Rush Farmer MD NK:[id number]   Lipid panel     Status: Abnormal   Collection Time: 03/10/21  3:52 AM  Result Value Ref Range   Cholesterol 215 (H) 0 - 169 mg/dL   Triglycerides 51 <150 mg/dL   HDL 63 >40 mg/dL   Total CHOL/HDL Ratio 3.4 RATIO   VLDL 10  0 - 40 mg/dL   LDL Cholesterol 142 (H) 0 - 99 mg/dL    Comment:        Total Cholesterol/HDL:CHD Risk Coronary Heart Disease Risk Table                     Men   Women  1/2 Average Risk   3.4   3.3  Average Risk       5.0   4.4  2 X Average Risk   9.6   7.1  3 X Average Risk  23.4   11.0        Use the calculated Patient Ratio above and the CHD Risk Table to determine the patient's CHD Risk.        ATP III CLASSIFICATION (LDL):  <100     mg/dL   Optimal  100-129  mg/dL   Near or Above                    Optimal  130-159  mg/dL   Borderline  160-189  mg/dL   High  >190     mg/dL   Very High Performed at [facility] [address], [place], [place] [address]   TSH     Status: None   Collection Time: 03/10/21  3:52 AM  Result Value Ref Range   TSH 1.881 0.350 - 4.500 uIU/mL    Comment: Performed by a 3rd Generation assay with a functional sensitivity of <=0.01 uIU/mL. Performed at [facility], [place] [address], , [place] [address]     Blood Alcohol level:  Lab Results  Component Value Date   ETH 10 (H) 03/09/2021   ETH <5 53/66/4403    Metabolic Disorder Labs:  Lab Results  Component Value Date   HGBA1C 5.4 03/10/2021   MPG 108 03/10/2021   MPG 111 12/26/2016   Lab Results   Component Value Date   PROLACTIN 2.0 01/19/2017   Lab Results  Component Value Date   CHOL 215 (H) 03/10/2021   TRIG 51 03/10/2021   HDL 63 03/10/2021   CHOLHDL 3.4 03/10/2021   VLDL 10 03/10/2021   LDLCALC 142 (H) 03/10/2021   LDLCALC 109 (H) 12/26/2016    Current Medications: Current Facility-Administered Medications  Medication Dose Route Frequency Provider Last Rate Last Admin   acetaminophen (TYLENOL) tablet 650 mg  650 mg Oral Q6H PRN White, Patrice L, NP       alum & mag hydroxide-simeth (MAALOX/MYLANTA) 200-200-20 MG/5ML suspension 30 mL  30 mL Oral Q4H PRN White, Patrice L, NP       feeding supplement (ENSURE ENLIVE / ENSURE PLUS) liquid 237 mL  237 mL Oral BID BM Lavella Hammock, MD   237 mL at 03/10/21 1805   hydrOXYzine (ATARAX/VISTARIL) tablet 25 mg  25 mg Oral TID PRN White, Patrice L, NP       magnesium hydroxide (MILK OF MAGNESIA) suspension 30 mL  30 mL Oral Daily PRN White, Patrice L, NP       PTA Medications: Medications Prior to Admission  Medication Sig Dispense Refill Last Dose   ONEXTON 1.2-3.75 % GEL Apply 1 application topically daily.      RHOFADE 1 % CREA Apply 1 application topically every morning.       Musculoskeletal: Strength & Muscle Tone: within normal limits Gait & Station: normal Patient leans: N/A            Psychiatric Specialty  Exam:  Presentation  General Appearance: Appropriate for Environment  Eye Contact:Fair  Speech:Clear and Coherent  Speech Volume:Normal  Handedness: No data recorded  Mood and Affect  Mood:Anxious; Depressed  Affect:Congruent   Thought Process  Thought Processes:Coherent; Goal Directed; Linear  Duration of Psychotic Symptoms: No data recorded Past Diagnosis of Schizophrenia or Psychoactive disorder: No  Descriptions of Associations:Intact  Orientation:Full (Time, Place and Person)  Thought Content:Logical  Hallucinations:Hallucinations: None  Ideas of  Reference:None  Suicidal Thoughts:Suicidal Thoughts: No SI Passive Intent and/or Plan: Without Intent; Without Plan  Homicidal Thoughts:Homicidal Thoughts: No   Sensorium  Memory:Immediate Good; Recent Good; Remote Good  Judgment:Fair  Insight:Fair   Executive Functions  Concentration:Good  Attention Span:Good  Recall:Good  Fund of Knowledge:Good  Language:Good   Psychomotor Activity  Psychomotor Activity:Psychomotor Activity: Normal   Assets  Assets:Communication Skills; Desire for Improvement; Housing; Leisure Time; Talents/Skills; Vocational/Educational; Social Support; Physical Health   Sleep  Sleep:Sleep: Fair    Physical Exam: Physical Exam Vitals and nursing note reviewed.  Constitutional:      General: She is not in acute distress.    Appearance: Normal appearance. She is not diaphoretic.  HENT:     Head: Normocephalic and atraumatic.  Pulmonary:     Effort: Pulmonary effort is normal.  Neurological:     General: No focal deficit present.     Mental Status: She is alert and oriented to person, place, and time.   ROS Psychiatric/Behavioral:  Positive for depression. The patient is nervous/anxious.   All other systems reviewed and are negative.  Blood pressure 104/74, pulse (!) 107, temperature 98.2 F (36.8 C), temperature source Oral, resp. rate 16, height _0  (1.6 m), weight 50.8 kg, last menstrual period 02/10/2021, SpO2 98 %. Body mass index is 19.84 kg/m.  Treatment Plan Summary: Daily contact with patient to assess and evaluate symptoms and progress in treatment and Medication management  Observation Level/Precautions:  15 minute checks  Laboratory:  CBC Chemistry Profile HbAIC HCG UDS Lipid panel, TSH   Available lab results reviewed.  CMP with sodium of 132, CO2 of 21 and alkaline phosphatase of 31 otherwise WNL.  Lipid panel with total cholesterol of 215, LDL of 142 and otherwise WNL.  CBC was WNL.  Neutrophil count was 8300  and differential was otherwise WNL.  Hemoglobin A1c was 5.4.  Urine pregnancy test was negative.  TSH was 1.881.  Urine drug screen was negative.  Psychotherapy: Encouraged participation in group therapy and therapeutic milieu.  Medications:  Will defer resuming treatment with Prozac for now since patient had recent ingestion of at least #20 20 mg tablets on 03/09/2021.  Plan will be eventually to resume treatment with Prozac and encourage daily adherence to medication.  Prozac will be prescribed for treatment of depression, anxiety and to reduce binge eating.  We will use as needed hydroxyzine for anxiety.  Patient has been written for Ensure supplements to improve caloric intake.  See MAR.  Consultations:    Discharge Concerns: Needs therapist outside the hospital.  Unclear whether she can return to prior psychiatric nurse practitioner.  Estimated LOS: 3 to 5 days  Other:     Physician Treatment Plan for Primary Diagnosis: MDD (major depressive disorder), recurrent severe, without psychosis ([place]) Long Term Goal(s): Improvement in symptoms so as ready for discharge  Short Term Goals: Ability to identify changes in lifestyle to reduce recurrence of condition will improve, Ability to verbalize feelings will improve, Ability to disclose and discuss suicidal ideas,  Ability to demonstrate self-control will improve, Ability to identify and develop effective coping behaviors will improve, Compliance with prescribed medications will improve, and Ability to identify triggers associated with substance abuse/mental health issues will improve  Physician Treatment Plan for Secondary Diagnosis: Principal Problem:   MDD (major depressive disorder), recurrent severe, without psychosis ([place]) Active Problems:   Social anxiety disorder  Long Term Goal(s): Improvement in symptoms so as ready for discharge  Short Term Goals: Ability to identify changes in lifestyle to reduce recurrence of condition will improve,  Ability to verbalize feelings will improve, Ability to disclose and discuss suicidal ideas, Ability to demonstrate self-control will improve, Ability to identify and develop effective coping behaviors will improve, Compliance with prescribed medications will improve, and Ability to identify triggers associated with substance abuse/mental health issues will improve  I certify that inpatient services furnished can reasonably be expected to improve the patient's condition.    Arthor Captain, MD 6/13/20221:42 PM

## 2021-03-12 MED ORDER — FLUOXETINE HCL 20 MG PO CAPS
20.0000 mg | ORAL_CAPSULE | Freq: Every day | ORAL | Status: DC
  Administered 2021-03-13: 20 mg via ORAL
  Filled 2021-03-12 (×2): qty 1

## 2021-03-12 NOTE — [facility] Group Notes (Signed)
Patient did not attend group   ADULT GRIEF GROUP NOTE:   Spiritual care group on grief and loss facilitated by chaplain Katy Rodrick Payson, [facility]   Group Goal:   Support / Education around grief and loss   Members engage in facilitated group support and psycho-social education.   Group Description:   Following introductions and group rules, group members engaged in facilitated group dialog and support around topic of loss, with particular support around experiences of loss in their lives. Group Identified types of loss (relationships / self / things) and identified patterns, circumstances, and changes that precipitate losses. Reflected on thoughts / feelings around loss, normalized grief responses, and recognized variety in grief experience. Group noted Worden's four tasks of grief in discussion.   Group drew on Adlerian / Rogerian, narrative, MI,    

## 2021-03-12 NOTE — Progress Notes (Signed)
D::  Patient denied SI and HI, denied A/V hallucinations.  Denied pain. A:  Medications administered per MD orders.  Emotional and encouragement given patient. R:  Safety maintained with 15 minute checks.

## 2021-03-12 NOTE — Progress Notes (Signed)
Adult Psychoeducational Group Note  Date:  03/12/2021 Time:  2:13 PM  Group Topic/Focus:  Goals Group:   The focus of this group is to help patients establish daily goals to achieve during treatment and discuss how the patient can incorporate goal setting into their daily lives to aide in recovery.  Participation Level:  Did Not Attend Kiara Moore 03/12/2021, 2:13 PM

## 2021-03-12 NOTE — Progress Notes (Signed)
   03/11/21 2214  Psych Admission Type (Psych Patients Only)  Admission Status Voluntary  Psychosocial Assessment  Patient Complaints Anxiety;Isolation  Eye Contact Brief  Facial Expression Animated  Affect Appropriate to circumstance  Speech Logical/coherent  Interaction Assertive  Motor Activity  (steady gait)  Appearance/Hygiene Unremarkable  Behavior Characteristics Cooperative;Calm  Mood Anxious;Depressed  Thought Process  Coherency WDL  Content WDL  Delusions WDL  Perception WDL  Hallucination None reported or observed  Judgment WDL  Confusion WDL  Danger to Self  Current suicidal ideation? Denies  Danger to Others  Danger to Others None reported or observed

## 2021-03-12 NOTE — Progress Notes (Signed)
Recreation Therapy Notes  Animal-Assisted Activity (AAA) Program Checklist/Progress Notes Patient Eligibility Criteria Checklist & Daily Group note for Rec Tx Intervention  Date: 6.14.22 Time: 1430 Location: [address]   AAA/T Program Assumption of Risk Form signed by Engineer, production or Parent Legal Guardian YES  Patient is free of allergies or severe asthma YES   Patient reports no fear of animals YES   Patient reports no history of cruelty to animals YES  Patient understands his/her participation is voluntary YES  Patient washes hands before animal contact YES  Patient washes hands after animal contact YES   Education: Charity fundraiser, Appropriate Animal Interaction   Education Outcome: Acknowledges understanding/In group clarification offered/Needs additional education.   Clinical Observations/Feedback: Pt did not attend activity.      Caroll Rancher, LRT/CTRS        Caroll Rancher A 03/12/2021 3:34 PM

## 2021-03-12 NOTE — Plan of Care (Signed)
Nurse discussed anxiety, depression and coping skills with patient.  

## 2021-03-12 NOTE — Progress Notes (Signed)
[facility] MD Progress Note  03/12/2021 4:07 PM Kiara Moore  MRN:  [id number]  Reason for admission: Kiara Moore is a 20 year old female with prior psychiatric diagnoses of depression and anorexia who presented to [place] ED on 03/09/2021 after she took approximately 20 tablets of fluoxetine in a suicide attempt at 1130AM that morning.    Objective: Medical record reviewed.  Patient's case discussed in detail with members of the treatment team.  I met with and evaluated the patient on the unit today for follow-up.  Patient presents with good eye contact and overall brighter affect.  She reports better mood today and denies feeling depressed.  She denies anhedonia.  She reports that she is sleeping and eating okay.  Patient denies any purging.  She denies passive wish for death, suicidal ideation or urges to engage in self-harm.  She denies AI, VH, AH or PI.  She is experiencing some fatigue which she attributes to taking an overdose of Prozac several days ago but denies other physical problems.  Due to restarting her fluoxetine at 20 mg a day tomorrow for depression and anxiety.  She would like to follow-up with an outpatient therapist and psychiatrist and prefers telehealth if possible.  Patient slept 6 hours last night.  No new labs.  Principal Problem: MDD (major depressive disorder), recurrent severe, without psychosis ([facility]) Diagnosis: Principal Problem:   MDD (major depressive disorder), recurrent severe, without psychosis ([place]) Active Problems:   Social anxiety disorder  Total Time spent with patient:  25 minutes  Past Psychiatric History: See admission H&P  Past Medical History:  Past Medical History:  Diagnosis Date   Eating disorder    Social anxiety disorder 03/11/2021    Past Surgical History:  Procedure Laterality Date   LAPAROSCOPIC APPENDECTOMY N/A 12/03/2016   Procedure: APPENDECTOMY LAPAROSCOPIC;  Surgeon: Gerald Stabs, MD;  Location: [facility];   Service: General;  Laterality: N/A;   Family History:  Family History  Problem Relation Age of Onset   Diabetes Maternal Grandmother    Diabetes Maternal Grandfather    Hypertension Maternal Grandfather    Diabetes Paternal Grandmother    Hypertension Paternal Grandmother    Diabetes Paternal Grandfather    Family Psychiatric  History: See admission H&P Social History:  Social History   Substance and Sexual Activity  Alcohol Use No     Social History   Substance and Sexual Activity  Drug Use No    Social History   Socioeconomic History   Marital status: Single    Spouse name: Not on file   Number of children: Not on file   Years of education: Not on file   Highest education level: Not on file  Occupational History   Not on file  Tobacco Use   Smoking status: Never   Smokeless tobacco: Never  Substance and Sexual Activity   Alcohol use: No   Drug use: No   Sexual activity: Never  Other Topics Concern   Not on file  Social History Narrative   ** Merged History Encounter **       Lives with Mother, Father, Sister (33 years old).  No pets in the house.  No smokers in the house.   Social Determinants of Health   Financial Resource Strain: Not on file  Food Insecurity: Not on file  Transportation Needs: Not on file  Physical Activity: Not on file  Stress: Not on file  Social Connections: Not on file   Additional Social History:  Sleep: Good  Appetite:  Good  Current Medications: Current Facility-Administered Medications  Medication Dose Route Frequency Provider Last Rate Last Admin   acetaminophen (TYLENOL) tablet 650 mg  650 mg Oral Q6H PRN White, Patrice L, NP       alum & mag hydroxide-simeth (MAALOX/MYLANTA) 200-200-20 MG/5ML suspension 30 mL  30 mL Oral Q4H PRN White, Patrice L, NP       feeding supplement (ENSURE ENLIVE / ENSURE PLUS) liquid 237 mL  237 mL Oral BID BM Lavella Hammock, MD   237 mL at 03/12/21 1010    hydrOXYzine (ATARAX/VISTARIL) tablet 25 mg  25 mg Oral TID PRN White, Patrice L, NP       magnesium hydroxide (MILK OF MAGNESIA) suspension 30 mL  30 mL Oral Daily PRN White, Patrice L, NP        Lab Results: No results found for this or any previous visit (from the past 48 hour(s)).  Blood Alcohol level:  Lab Results  Component Value Date   ETH 10 (H) 03/09/2021   ETH <5 44/11/4740    Metabolic Disorder Labs: Lab Results  Component Value Date   HGBA1C 5.4 03/10/2021   MPG 108 03/10/2021   MPG 111 12/26/2016   Lab Results  Component Value Date   PROLACTIN 2.0 01/19/2017   Lab Results  Component Value Date   CHOL 215 (H) 03/10/2021   TRIG 51 03/10/2021   HDL 63 03/10/2021   CHOLHDL 3.4 03/10/2021   VLDL 10 03/10/2021   LDLCALC 142 (H) 03/10/2021   LDLCALC 109 (H) 12/26/2016    Physical Findings: AIMS:  , ,  ,  ,    CIWA:    COWS:     Musculoskeletal: Strength & Muscle Tone: within normal limits Gait & Station: normal Patient leans: N/A  Psychiatric Specialty Exam:  Presentation  General Appearance: Appropriate for Environment  Eye Contact:Fair  Speech:Clear and Coherent  Speech Volume:Normal  Handedness: No data recorded  Mood and Affect  Mood:Anxious ("Better")  Affect:Congruent   Thought Process  Thought Processes:Coherent; Goal Directed; Linear  Descriptions of Associations:Intact  Orientation:Full (Time, Place and Person)  Thought Content:Logical  History of Schizophrenia/Schizoaffective disorder:No  Duration of Psychotic Symptoms:No data recorded Hallucinations:Hallucinations: None  Ideas of Reference:None  Suicidal Thoughts:Suicidal Thoughts: No  Homicidal Thoughts:Homicidal Thoughts: No   Sensorium  Memory:Immediate Good; Recent Good; Remote Good  Judgment:Fair  Insight:Fair   Executive Functions  Concentration:Good  Attention Span:Good  [place] of Knowledge:Good  Language:Good   Psychomotor  Activity  Psychomotor Activity:Psychomotor Activity: Normal   Assets  Assets:Communication Skills; Desire for Improvement; Housing; Leisure Time; Physical Health; Resilience; Social Support; Talents/Skills; Vocational/Educational   Sleep  Sleep:Sleep: Good Number of Hours of Sleep: 6    Physical Exam: Physical Exam Vitals and nursing note reviewed.  Constitutional:      General: She is not in acute distress. HENT:     Head: Normocephalic and atraumatic.  Pulmonary:     Effort: Pulmonary effort is normal.  Neurological:     General: No focal deficit present.     Mental Status: She is alert and oriented to person, place, and time.   Review of Systems  Constitutional:  Positive for malaise/fatigue.  Respiratory: Negative.    Cardiovascular: Negative.   Gastrointestinal: Negative.   Neurological: Negative.   Psychiatric/Behavioral:  Negative for hallucinations and suicidal ideas. The patient is nervous/anxious. The patient does not have insomnia.   All other systems reviewed and are negative. Blood  pressure 104/74, pulse (!) 107, temperature 98.2 F (36.8 C), temperature source Oral, resp. rate 16, height _0  (1.6 m), weight 50.8 kg, last menstrual period 02/10/2021, SpO2 98 %. Body mass index is 19.84 kg/m.   Treatment Plan Summary: Daily contact with patient to assess and evaluate symptoms and progress in treatment and Medication management  Continue every 15-minute observation status  Encouraged participation in group therapy and therapeutic milieu  Depression/anxiety -Will re-start Prozac 20 mg daily tomorrow (03/13/2021) for depression and anxiety symptoms -Continue hydroxyzine 25 mg 3 times daily PRN  Will continue Ensure supplements twice daily  Discharge planning in progress.  Patient will need referrals for new outpatient therapist and outpatient psychiatrist..   Arthor Captain, MD 03/12/2021, 4:07 PM

## 2021-03-12 NOTE — Progress Notes (Signed)
Adult Psychoeducational Group Note  Date:  03/12/2021 Time:  4:26 PM  Group Topic/Focus:  Healthy Communication:   The focus of this group is to discuss communication, barriers to communication, as well as healthy ways to communicate with others.  Participation Level:  Did Not Attend Kiara Moore Kiara Moore 03/12/2021, 4:26 PM 

## 2021-03-13 MED ORDER — FLUOXETINE HCL 20 MG PO CAPS: 20.0000 mg | ORAL_CAPSULE | Freq: Every day | ORAL | 0 refills | Status: AC

## 2021-03-13 NOTE — Progress Notes (Signed)
Recreation Therapy Notes  Date: 6.15.22 Time: 0930 Location: [address] Dayroom  Group Topic: Stress Management   Goal Area(s) Addresses:  Patient will actively participate in stress management techniques presented during session.  Patient will successfully identify benefit of practicing stress management post d/c.   Intervention: Guided exercise with ambient sound and script  Activity :Guided Imagery  LRT provided education, instruction, and demonstration on practice of visualization via guided imagery. Patient was asked to participate in the technique introduced during session. LRT also debriefed including topics of mindfulness, stress management and specific scenarios each patient could use these techniques. Patients were given suggestions of ways to access scripts post d/c and encouraged to explore Youtube and other apps available on smartphones, tablets, and computers.   Education:  Stress Management, Discharge Planning.   Education Outcome: Acknowledges education  Clinical Observations/Feedback: Patient did not attend group activity.     Caroll Rancher, LRT/CTRS         Lillia Abed, Amara Manalang A 03/13/2021 11:40 AM

## 2021-03-13 NOTE — Progress Notes (Signed)
Pt has been isolative this evening, pt stated she does not like being around many people due to being anxious. Pt reported good day and looking forward to being discharged. Pt denies SI/HI and contracted for safety, will continue to monitor.

## 2021-03-13 NOTE — Progress Notes (Signed)
Discharge Note:  Patient denies SI/HI AVH at this time. Discharge instructions, AVS, prescriptions and transition record gone over with patient. Patient agrees to comply with medication management, follow-up visit, and outpatient therapy. Patient belongings returned to patient. Patient questions and concerns addressed and answered.  Patient ambulatory off unit.  Patient discharged to home.   

## 2021-03-13 NOTE — Discharge Summary (Signed)
Physician Discharge Summary Note  Patient:  Kiara Moore is an 20 y.o., female MRN:  [id number] DOB:  October 15, 2000 Patient phone:  [phone] (home)  Patient address:   [address] [place] [address],  Total Time spent with patient: 30 minutes  Date of Admission:  03/10/2021 Date of Discharge: 03/13/2021  Reason for Admission:  (From MD's admission note): Kiara Moore is a 20 year old female with prior psychiatric diagnoses of depression and anorexia who presented to [facility] ED on 03/09/2021 after she took approximately 20 tablets of fluoxetine in a suicide attempt at 1130AM that morning.  Following this overdose she experienced some nausea, chills and headache but no other sequelae.  Initial EKG on 03/09/2021 revealed normal sinus rhythm with ventricular rate of 87 and QT/QTc of 334/401.  Subsequent EKG at approximately 6:20 PM that evening revealed sinus rhythm with ventricular rate of 85 and QT/QTc of 377/449.  After being deemed medically stable, she was transferred to [facility] and then to [facility] on 03/10/2021 for further treatment of depression and anxiety symptoms.  On interview with me today, the patient acknowledges that she took the overdose of Prozac in a suicide attempt.  The patient states that she had been started on Prozac in February 2022 for depression and anxiety.  She took Prozac consistently for about a month but since March or April she has only been taking it about 2 times per week.  She reports experiencing significant stress with her demanding academic load at school and that she had felt "tired of a lot of things" in the months leading up to the attempt and had an argument with her mother just prior to the overdose.  Patient states that she is a perfectionist and feels she needs to consistently live up to high expectations and standards.  She reports symptoms at least for the past 2 weeks including anhedonia,  intermittently depressed mood, decreased sleep, decreased energy, decreased interest, decreased appetite, problems concentrating, feelings of guilt and worthlessness, intermittent irritability with reactions to stressors out of proportion to the degree of the stressor, daytime napping.  Patient reports experiencing suicidal ideation on and off since the seventh grade but is never made attempt except for the 1 prior to the current admission.  Patient also reports concerns about her weight and appearance and more frequently has been focused on counting calories, food intake and body image issues.  She has engaged in intermittent binging on food followed by restriction of food intake.  She denies vomiting, laxative use or excessive exercise.  Today she states that her mood is "fine" and denies thoughts of harming herself in the hospital.  She denies AI, HI, PI, AH or VH.  She reports feeling a bit jittery today but denies other physical experiences attributable to her recent Prozac overdose.  Patient states she would like to have a therapist outside the hospital.  She currently has a psychiatric nurse practitioner prescribing medications for her but does not have a therapist.   The patient states she has been diagnosed with depression and social anxiety disorder in the past as well as anorexia.  The patient reports 1 prior inpatient psychiatric admission to [facility] at age 29 for treatment of anorexia/bulimia and suicidal ideation.  Review of medical records indicate she was admitted from 12/25/2016 until 12/30/2016 and was diagnosed with MDD and anorexia and treated with Prozac 20 mg daily and hydroxyzine 10 mg 3 times daily as needed.  She denies any  prior suicide attempts.  Patient denies any symptoms that meet criteria for hypomania or mania.  She reports a history of nonsuicidal self-injurious behavior by scratching herself when she is angry.  Patient reports that she experiences social anxiety that occurs when she  is meeting groups of people, introducing herself to others or engages in public speaking.  She worries that others will judge her negatively.  She experiences symptoms of anxiety, worry, increased heart rate, increased respiratory rate and feeling like her throat is closing.  She sees a psychiatric nurse practitioner Everlena Cooper at [facility] student psych services.   The patient denies any history of use of alcohol, marijuana, other drugs.  She does not vape or use tobacco products.  The patient reports that she has a sister with Williams syndrome who experiences some anxiety.  She denies any family history of suicide or other mental health issues.  The patient reports denies any history of asthma, seizure, concussion.  She denies any known drug allergies.    Principal Problem: MDD (major depressive disorder), recurrent severe, without psychosis (HCC) Discharge Diagnoses: Principal Problem:   MDD (major depressive disorder), recurrent severe, without psychosis (HCC) Active Problems:   Social anxiety disorder   Past Psychiatric History: See H&P  Past Medical History:  Past Medical History:  Diagnosis Date   Eating disorder    Social anxiety disorder 03/11/2021    Past Surgical History:  Procedure Laterality Date   LAPAROSCOPIC APPENDECTOMY N/A 12/03/2016   Procedure: APPENDECTOMY LAPAROSCOPIC;  Surgeon: Leonia Corona, MD;  Location: [facility] OR;  Service: General;  Laterality: N/A;   Family History:  Family History  Problem Relation Age of Onset   Diabetes Maternal Grandmother    Diabetes Maternal Grandfather    Hypertension Maternal Grandfather    Diabetes Paternal Grandmother    Hypertension Paternal Grandmother    Diabetes Paternal Grandfather    Family Psychiatric  History: See H&P Social History:  Social History   Substance and Sexual Activity  Alcohol Use No     Social History   Substance and Sexual Activity  Drug Use No    Social History   Socioeconomic  History   Marital status: Single    Spouse name: Not on file   Number of children: Not on file   Years of education: Not on file   Highest education level: Not on file  Occupational History   Not on file  Tobacco Use   Smoking status: Never   Smokeless tobacco: Never  Substance and Sexual Activity   Alcohol use: No   Drug use: No   Sexual activity: Never  Other Topics Concern   Not on file  Social History Narrative   ** Merged History Encounter **       Lives with Mother, Father, Sister (31 years old).  No pets in the house.  No smokers in the house.   Social Determinants of Health   Financial Resource Strain: Not on file  Food Insecurity: Not on file  Transportation Needs: Not on file  Physical Activity: Not on file  Stress: Not on file  Social Connections: Not on file    Hospital Course:  After the above admission evaluation, Adrian's presenting symptoms were noted. She was recommended for mood stabilization treatments. The medication regimen targeting those presenting symptoms were discussed with her & initiated with her consent. She was started on Prozac for her depression. She stated she feels better and feels her depression is better today. She denies  any suicidal thoughts. She is returning home to live with her parents. Her UDS and BAL on arrival to the ED were negative.  She was medicated, stabilized & discharged on the medications as listed on her discharge medication list below. Besides the mood stabilization treatments, Sallyanne was also enrolled & participated in the group counseling sessions being offered & held on this unit. He/She learned coping skills. She presented no other significant pre-existing medical issues that required treatment. She tolerated her treatment regimen without any adverse effects or reactions reported.   During the course of her hospitalization, the 15-minute checks were adequate to ensure patient's safety. Sherese did not display any  dangerous, violent or suicidal behavior on the unit.  She interacted with patients & staff appropriately, participated appropriately in the group sessions/therapies. Her medications were addressed & adjusted to meet her needs. She was recommended for outpatient follow-up care & medication management upon discharge to assure continuity of care & mood stability.  At the time of discharge patient is not reporting any acute suicidal/homicidal ideations. He/She feels more confident about her self-care & in managing his mental health. She currently denies any new issues or concerns. Education and supportive counseling provided throughout her hospital stay & upon discharge.   Today upon her discharge evaluation with the attending psychiatrist, Sarely shares she is doing well and is stable for discharge. She is looking forward to returning to college. She feels safe and supported at home. She denies any other specific concerns. She is sleeping well. Her appetite is good. She denies other physical complaints. She denies AH/VH, delusional thoughts or paranoia. She does not appear to be responding to any internal stimuli. She feels that her medications have been helpful & is in agreement to continue her current treatment regimen as recommended. She was able to engage in safety planning including plan to return to [facility] or contact emergency services if she feels unable to maintain her own safety or the safety of others. Pt had no further questions, comments, or concerns. She left [facility] with all personal belongings in no apparent distress. Transportation per private vehicle with her father.    Physical Findings: AIMS:  , ,  ,  ,    CIWA:    COWS:     Musculoskeletal: Strength & Muscle Tone: within normal limits Gait & Station: normal Patient leans: N/A  Psychiatric Specialty Exam:  Presentation  General Appearance: Appropriate for Environment  Eye Contact:Good  Speech:Clear and Coherent; Normal Rate  Speech  Volume:Normal  Handedness: No data recorded  Mood and Affect  Mood:Euthymic  Affect:Full Range; Congruent  Thought Process  Thought Processes:Coherent; Goal Directed; Linear  Descriptions of Associations:Intact  Orientation:Full (Time, Place and Person)  Thought Content:Logical  History of Schizophrenia/Schizoaffective disorder:No  Duration of Psychotic Symptoms:No data recorded Hallucinations:Hallucinations: None  Ideas of Reference:None  Suicidal Thoughts:Suicidal Thoughts: No  Homicidal Thoughts:Homicidal Thoughts: No  Sensorium  Memory:Immediate Good; Recent Good; Remote Good  Judgment:Good  Insight:Good  Executive Functions  Concentration:Good  Attention Span:Good  Recall:Good  Fund of Knowledge:Good  Language:Good  Psychomotor Activity  Psychomotor Activity:Psychomotor Activity: Normal  Assets  Assets:Communication Skills; Desire for Improvement; Housing; Physical Health; Resilience; Social Support; Talents/Skills; Vocational/Educational  Sleep  Sleep:Sleep: Good Number of Hours of Sleep: 6.75  Physical Exam: Physical Exam Vitals and nursing note reviewed.  Constitutional:      Appearance: Normal appearance.  HENT:     Head: Normocephalic.  Pulmonary:     Effort: Pulmonary effort is normal.  Musculoskeletal:  General: Normal range of motion.     Cervical back: Normal range of motion.  Neurological:     Mental Status: She is alert and oriented to person, place, and time.  Psychiatric:        Attention and Perception: Attention normal. She does not perceive auditory or visual hallucinations.        Mood and Affect: Mood normal.        Speech: Speech normal.        Behavior: Behavior normal. Behavior is cooperative.        Thought Content: Thought content is not paranoid or delusional. Thought content does not include homicidal or suicidal ideation. Thought content does not include homicidal or suicidal plan.        Cognition and  Memory: Cognition normal.   Review of Systems  Constitutional:  Negative for fever.  HENT:  Negative for congestion, sinus pain and sore throat.   Respiratory:  Negative for cough and shortness of breath.   Cardiovascular: Negative.   Gastrointestinal: Negative.   Genitourinary: Negative.   Musculoskeletal: Negative.   Neurological: Negative.   Blood pressure 104/74, pulse (!) 107, temperature 98.2 F (36.8 C), temperature source Oral, resp. rate 16, height 5\' 3"  (1.6 m), weight 50.8 kg, SpO2 98 %. Body mass index is 19.84 kg/m.   Social History   Tobacco Use  Smoking Status Never  Smokeless Tobacco Never   Tobacco Cessation:  N/A, patient does not currently use tobacco products   Blood Alcohol level:  Lab Results  Component Value Date   ETH 10 (H) 03/09/2021   ETH <5 12/25/2016    Metabolic Disorder Labs:  Lab Results  Component Value Date   HGBA1C 5.4 03/10/2021   MPG 108 03/10/2021   MPG 111 12/26/2016   Lab Results  Component Value Date   PROLACTIN 2.0 01/19/2017   Lab Results  Component Value Date   CHOL 215 (H) 03/10/2021   TRIG 51 03/10/2021   HDL 63 03/10/2021   CHOLHDL 3.4 03/10/2021   VLDL 10 03/10/2021   LDLCALC 142 (H) 03/10/2021   LDLCALC 109 (H) 12/26/2016    See Psychiatric Specialty Exam and Suicide Risk Assessment completed by Attending Physician prior to discharge.  Discharge destination:  Home  Is patient on multiple antipsychotic therapies at discharge:  No   Has Patient had three or more failed trials of antipsychotic monotherapy by history:  No  Recommended Plan for Multiple Antipsychotic Therapies: NA  Discharge Instructions     Diet - low sodium heart healthy   Complete by: As directed    Increase activity slowly   Complete by: As directed       Allergies as of 03/13/2021   No Known Allergies      Medication List     TAKE these medications      Indication  FLUoxetine 20 MG capsule Commonly known as:  PROZAC Take 1 capsule (20 mg total) by mouth daily. Start taking on: March 14, 2021  Indication: Depression   Onexton 1.2-3.75 % Gel Generic drug: Clindamycin Phos-Benzoyl Perox Apply 1 application topically daily.  Indication: Common Acne   Rhofade 1 % Crea Generic drug: Oxymetazoline HCl Apply 1 application topically every morning.  Indication: Rosacea        Follow-up Information     [facility] Follow up on 03/26/2021.   Why: You have an appointment to re-establish care for therapy services with 03/28/2021 on 03/26/21 at 11:00  am.  This appointment will be Virtual. Contact information: [address] [place] [address] [place] [address] [phone]         [facility] Go on 04/03/2021.   Why: You have an appointment for medication management services on 04/03/21 at 10:30 am.  This appointment will be Virtual. Contact information: [address] [place] [address] [phone]                 Follow-up recommendations:  Activity:  as tolerated Diet:  Heart healthy  Comments:  Prescriptions were given at discharge.  Patient  is agreeable with the discharge plan.  She was given an opportunity to ask questions.  She appears to feel comfortable with discharge and denies any current suicidal or homicidal thoughts.   Patient is instructed prior to discharge to: Take all medications as prescribed by her mental healthcare provider. Report any adverse effects and or reactions from the medicines to her outpatient provider promptly. Patient has been instructed & cautioned: To not engage in alcohol and or illegal drug use while on prescription medicines. In the event of worsening symptoms, patient is instructed to call the crisis hotline, 911 and or go to the nearest ED for appropriate evaluation and treatment of symptoms. To follow-up with her primary care provider for your other medical issues, concerns and or  health care needs.   Signed: Laveda AbbeLaurie Britton Shalena Ezzell, NP 03/13/2021, 10:51 AM

## 2021-03-13 NOTE — [facility] Suicide Risk Assessment (Signed)
[facility] Discharge Suicide Risk Assessment   Principal Problem: MDD (major depressive disorder), recurrent severe, without psychosis (HCC) Discharge Diagnoses: Principal Problem:   MDD (major depressive disorder), recurrent severe, without psychosis (HCC) Active Problems:   Social anxiety disorder   Total Time spent with patient: 20 minutes  Musculoskeletal: Strength & Muscle Tone: within normal limits Gait & Station: normal Patient leans: N/A  Psychiatric Specialty Exam  Presentation  General Appearance: Appropriate for Environment  Eye Contact:Good  Speech:Clear and Coherent; Normal Rate  Speech Volume:Normal  Handedness: No data recorded  Mood and Affect  Mood:Euthymic  Duration of Depression Symptoms: Greater than two weeks  Affect:Full Range; Congruent   Thought Process  Thought Processes:Coherent; Goal Directed; Linear  Descriptions of Associations:Intact  Orientation:Full (Time, Place and Person)  Thought Content:Logical  History of Schizophrenia/Schizoaffective disorder:No  Duration of Psychotic Symptoms:No data recorded Hallucinations:Hallucinations: None  Ideas of Reference:None  Suicidal Thoughts:Suicidal Thoughts: No  Homicidal Thoughts:Homicidal Thoughts: No   Sensorium  Memory:Immediate Good; Recent Good; Remote Good  Judgment:Good  Insight:Good   Executive Functions  Concentration:Good  Attention Span:Good  Recall:Good  Fund of Knowledge:Good  Language:Good   Psychomotor Activity  Psychomotor Activity:Psychomotor Activity: Normal   Assets  Assets:Communication Skills; Desire for Improvement; Housing; Physical Health; Resilience; Social Support; Talents/Skills; Vocational/Educational   Sleep  Sleep:Sleep: Good Number of Hours of Sleep: 6.75   Physical Exam: Physical Exam Vitals and nursing note reviewed.  Constitutional:      General: She is not in acute distress.    Appearance: Normal appearance.  HENT:      Head: Normocephalic and atraumatic.  Pulmonary:     Effort: Pulmonary effort is normal.  Neurological:     General: No focal deficit present.     Mental Status: She is alert and oriented to person, place, and time.   Review of Systems  Constitutional: Negative.   Respiratory: Negative.    Cardiovascular: Negative.   Gastrointestinal: Negative.   Musculoskeletal: Negative.   Neurological: Negative.   Psychiatric/Behavioral:  Negative for hallucinations, substance abuse and suicidal ideas.   All other systems reviewed and are negative. Blood pressure 104/74, pulse (!) 107, temperature 98.2 F (36.8 C), temperature source Oral, resp. rate 16, height 5\' 3"  (1.6 m), weight 50.8 kg, SpO2 98 %. Body mass index is 19.84 kg/m.  Mental Status Per Nursing Assessment::   On Admission:  Suicidal ideation indicated by patient  Demographic Factors:  Adolescent or young adult  Loss Factors: NA  Historical Factors: History of suicide attempt and Impulsivity  Risk Reduction Factors:   Sense of responsibility to family, Living with another person, especially a relative, Positive social support, and Enrolled in school  Continued Clinical Symptoms:  Anxiety - improving Depression - improving Previous Psychiatric Diagnoses and Treatments  Cognitive Features That Contribute To Risk:  None    Suicide Risk:  Minimal: No identifiable suicidal ideation.  Patients presenting with no risk factors but with morbid ruminations; may be classified as minimal risk based on the severity of the depressive symptoms   Follow-up Information     [facility] Follow up on 03/26/2021.   Why: You have an appointment to re-establish care for therapy services with 03/28/2021 on 03/26/21 at 11:00 am.  This appointment will be Virtual. Contact information: [address] [place] [phone]         [facility] Go on 04/03/2021.   Why:  You have an appointment for  medication management services on 04/03/21 at 10:30 am.  This appointment will be Virtual. Contact information: [address] [place] [address] [phone]                 Plan Of Care/Follow-up recommendations:  Activity:  As tolerated  Other:   -Take medications as prescribed.   -Do not drink alcohol.  Do not use marijuana or other drugs.   -Keep outpatient mental health follow-up appointments with therapist and psychiatrist.  -See your primary care provider for wellness checkups and treatment of medical conditions.  Claudie Revering, MD 03/13/2021, 9:30 AM

## 2021-03-13 NOTE — Progress Notes (Signed)
  [facility] Adult Case Management Discharge Plan :  Will you be returning to the same living situation after discharge:  Yes,  home with parents. At discharge, do you have transportation home?: Yes,  Patient's father will transport pt at time of discharge. Do you have the ability to pay for your medications: Yes,  Pt has active medical coverage.  Release of information consent forms completed and in the chart;  Patient's signature needed at discharge.  Patient to Follow up at:  Follow-up Information     [facility] Follow up on 03/26/2021.   Why: You have an appointment to re-establish care for therapy services with Ronny Flurry on 03/26/21 at 11:00 am.  This appointment will be Virtual. Contact information: [address] [place] [address] [phone]         [facility] Go on 04/03/2021.   Why: You have an appointment for medication management services on 04/03/21 at 10:30 am.  This appointment will be Virtual. Contact information: [address] [place] [address] [phone]                 Next level of care provider has access to [facility] Link:no  Safety Planning and Suicide Prevention discussed: Yes,  SPE reviewed with Kiara Moore, Father, [phone].  Has patient been referred to the Quitline?: N/A patient is not a smoker  Patient has been referred for addiction treatment: N/A  Leisa Lenz, LCSW 03/13/2021, 9:40 AM

## 2021-03-13 NOTE — Progress Notes (Signed)
Adult Psychoeducational Group Note  Date:  03/13/2021 Time:  10:53 AM  Group Topic/Focus:  Goals Group:   The focus of this group is to help patients establish daily goals to achieve during treatment and discuss how the patient can incorporate goal setting into their daily lives to aide in recovery.  Participation Level:  Did Not Attend  Additional Comments:  Pt was speaking w the MD.  Deforest Hoyles Michi Herrmann 03/13/2021, 10:53 AM
